# Patient Record
Sex: Male | Born: 1935 | Race: White | Hispanic: No | Marital: Married | Smoking: Former smoker
Health system: Southern US, Community
[De-identification: ages and names within clinical notes are randomized; demographics above are authoritative.]

## PROBLEM LIST (undated history)

## (undated) DIAGNOSIS — M791 Myalgia, unspecified site: Secondary | ICD-10-CM

## (undated) DIAGNOSIS — C768 Malignant neoplasm of other specified ill-defined sites: Secondary | ICD-10-CM

## (undated) DIAGNOSIS — J449 Chronic obstructive pulmonary disease, unspecified: Secondary | ICD-10-CM

## (undated) DIAGNOSIS — C61 Malignant neoplasm of prostate: Secondary | ICD-10-CM

## (undated) DIAGNOSIS — I517 Cardiomegaly: Secondary | ICD-10-CM

## (undated) DIAGNOSIS — E785 Hyperlipidemia, unspecified: Secondary | ICD-10-CM

## (undated) DIAGNOSIS — E119 Type 2 diabetes mellitus without complications: Secondary | ICD-10-CM

## (undated) DIAGNOSIS — Z950 Presence of cardiac pacemaker: Secondary | ICD-10-CM

## (undated) DIAGNOSIS — I2510 Atherosclerotic heart disease of native coronary artery without angina pectoris: Secondary | ICD-10-CM

## (undated) DIAGNOSIS — M1990 Unspecified osteoarthritis, unspecified site: Secondary | ICD-10-CM

## (undated) DIAGNOSIS — I10 Essential (primary) hypertension: Secondary | ICD-10-CM

## (undated) DIAGNOSIS — K5732 Diverticulitis of large intestine without perforation or abscess without bleeding: Secondary | ICD-10-CM

## (undated) DIAGNOSIS — M810 Age-related osteoporosis without current pathological fracture: Secondary | ICD-10-CM

## (undated) DIAGNOSIS — I442 Atrioventricular block, complete: Secondary | ICD-10-CM

## (undated) HISTORY — DX: Unspecified osteoarthritis, unspecified site: M19.90

## (undated) HISTORY — DX: Cardiomegaly: I51.7

## (undated) HISTORY — DX: Atrioventricular block, complete: I44.2

## (undated) HISTORY — DX: Malignant neoplasm of other specified ill-defined sites: C76.8

## (undated) HISTORY — DX: Hyperlipidemia, unspecified: E78.5

## (undated) HISTORY — DX: Atherosclerotic heart disease of native coronary artery without angina pectoris: I25.10

## (undated) HISTORY — DX: Diverticulitis of large intestine without perforation or abscess without bleeding: K57.32

## (undated) HISTORY — DX: Chronic obstructive pulmonary disease, unspecified: J44.9

## (undated) HISTORY — PX: CORONARY ANGIOPLASTY: SHX604

## (undated) HISTORY — DX: Malignant neoplasm of prostate: C61

## (undated) HISTORY — PX: BACK SURGERY: SHX140

## (undated) HISTORY — PX: OTHER SURGICAL HISTORY: SHX169

## (undated) HISTORY — PX: PROSTATE SURGERY: SHX751

## (undated) HISTORY — DX: Myalgia, unspecified site: M79.10

## (undated) HISTORY — DX: Age-related osteoporosis without current pathological fracture: M81.0

## (undated) HISTORY — DX: Essential (primary) hypertension: I10

## (undated) HISTORY — PX: PACEMAKER INSERTION: SHX728

## (undated) HISTORY — PX: TOTAL HIP ARTHROPLASTY: SHX124

## (undated) HISTORY — DX: Type 2 diabetes mellitus without complications: E11.9

## (undated) HISTORY — DX: Presence of cardiac pacemaker: Z95.0

---

## 2000-04-12 ENCOUNTER — Inpatient Hospital Stay (HOSPITAL_COMMUNITY): Admission: RE | Admit: 2000-04-12 | Discharge: 2000-04-14 | Payer: Self-pay | Admitting: Neurosurgery

## 2000-04-12 ENCOUNTER — Encounter: Payer: Self-pay | Admitting: Neurosurgery

## 2004-08-04 ENCOUNTER — Ambulatory Visit: Payer: Self-pay | Admitting: Endocrinology

## 2004-10-05 ENCOUNTER — Ambulatory Visit: Payer: Self-pay | Admitting: Unknown Physician Specialty

## 2006-06-28 ENCOUNTER — Other Ambulatory Visit: Payer: Self-pay

## 2006-06-29 ENCOUNTER — Inpatient Hospital Stay: Payer: Self-pay | Admitting: Unknown Physician Specialty

## 2007-03-17 ENCOUNTER — Encounter (INDEPENDENT_AMBULATORY_CARE_PROVIDER_SITE_OTHER): Payer: Self-pay | Admitting: *Deleted

## 2007-03-17 ENCOUNTER — Emergency Department: Payer: Self-pay | Admitting: Emergency Medicine

## 2007-03-17 ENCOUNTER — Inpatient Hospital Stay (HOSPITAL_COMMUNITY): Admission: AD | Admit: 2007-03-17 | Discharge: 2007-03-29 | Payer: Self-pay | Admitting: *Deleted

## 2007-03-23 ENCOUNTER — Ambulatory Visit: Payer: Self-pay | Admitting: Internal Medicine

## 2008-07-12 IMAGING — CR DG CHEST 1V
1 series · 1 of 1 positions shown · non-contrast
Comparison: none

REASON FOR EXAM: Preop for hip fracture
COMMENTS:

[view not recorded]
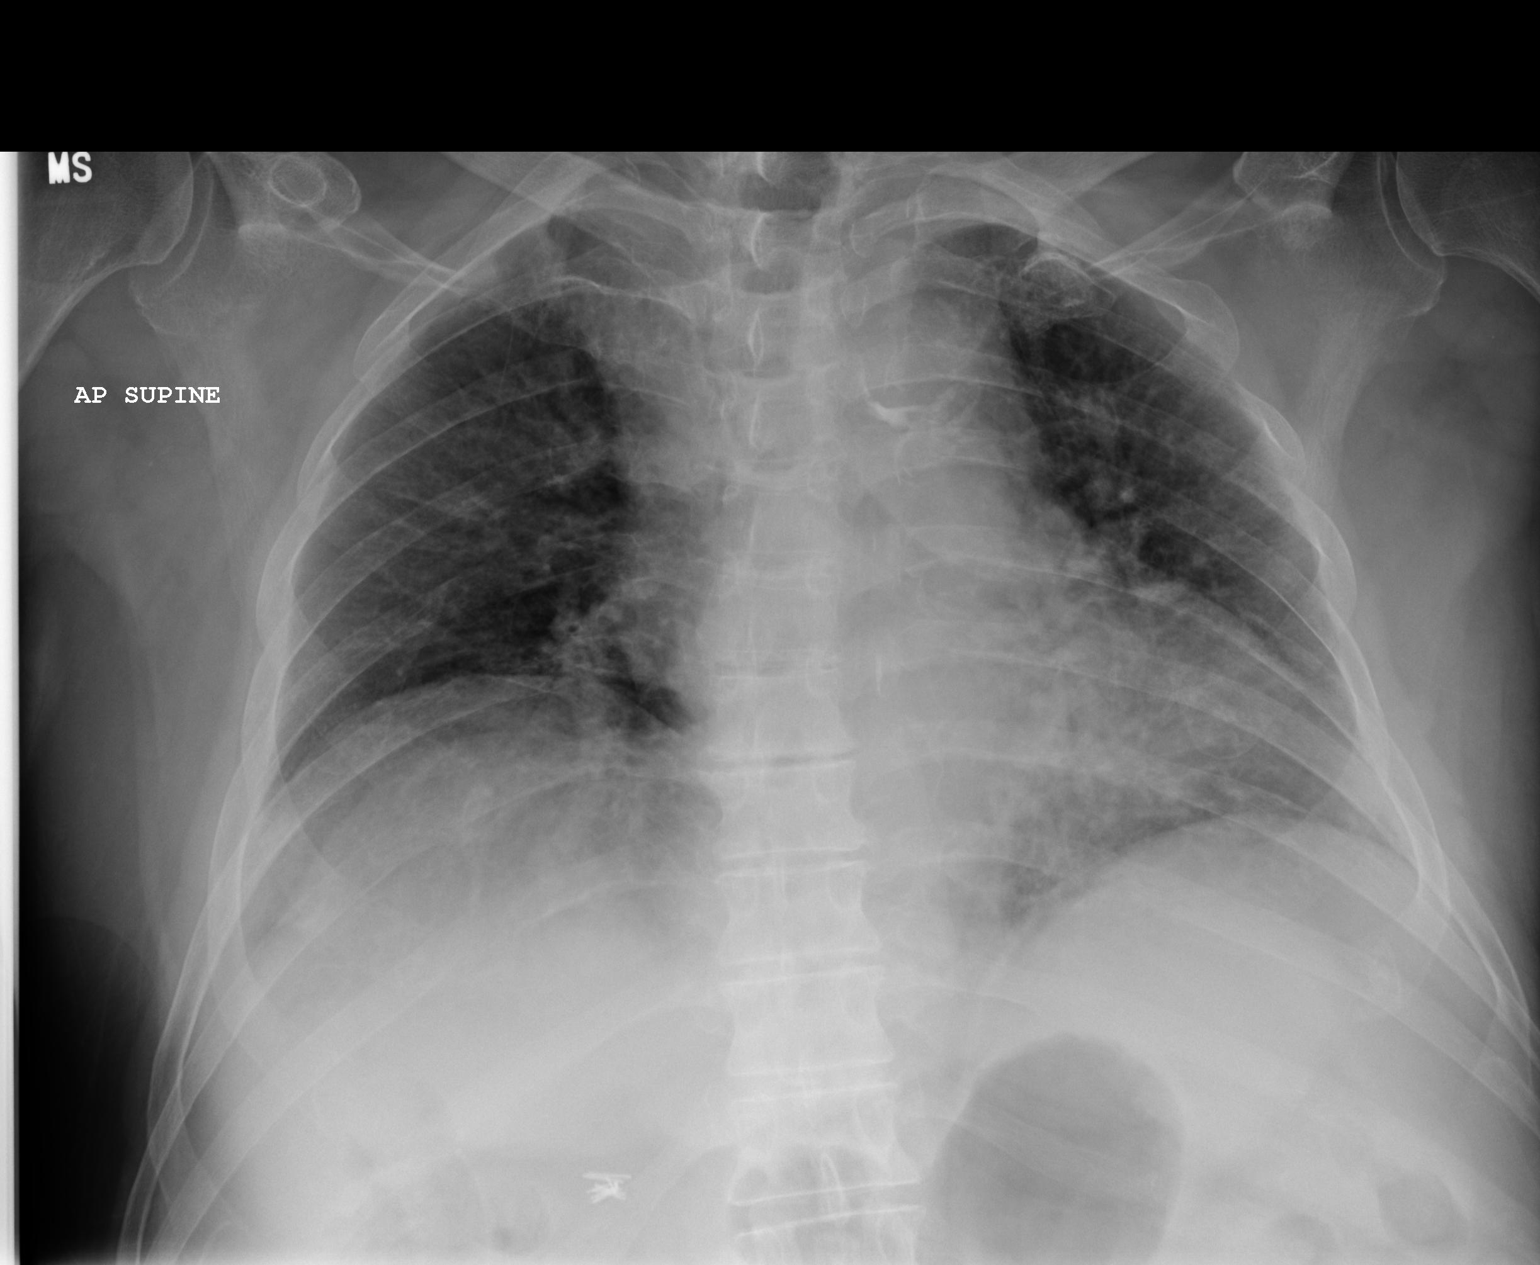

[1 of 1 positions shown; findings below may reference images not displayed]

PROCEDURE:     DXR - DXR CHEST 1 VIEWAP OR PA  - June 29, 2006 [DATE]

RESULT:     Single AP portable exam reveals incomplete inspiratory effort.
Mild cardiomegaly with LEFT ventricular prominence is seen.  Vascularity
appears top normal to mildly prominent.  There are findings which can be
compatible with mild pulmonary venous congestion.  No definite infiltrates.
No effusions.
IMPRESSION: Incomplete inspiratory effort.

Mild cardiomegaly with possibly mild pulmonary venous congestion.

## 2008-07-12 IMAGING — CR DG HIP COMPLETE 2+V*L*
1 series · 2 of 2 positions shown · non-contrast
Comparison: none

REASON FOR EXAM: fall, LEFT hip pain and deformity
COMMENTS:

PROCEDURE:     DXR - DXR HIP LEFT COMPLETE  - June 29, 2006 [DATE]
RESULT:          AP and lateral view reveals a transverse femoral neck
fracture.  The joint space appears intact.

[Series 1: view not recorded · 0.17mm/px · 2 of 2 slices shown]
[im 1/2]
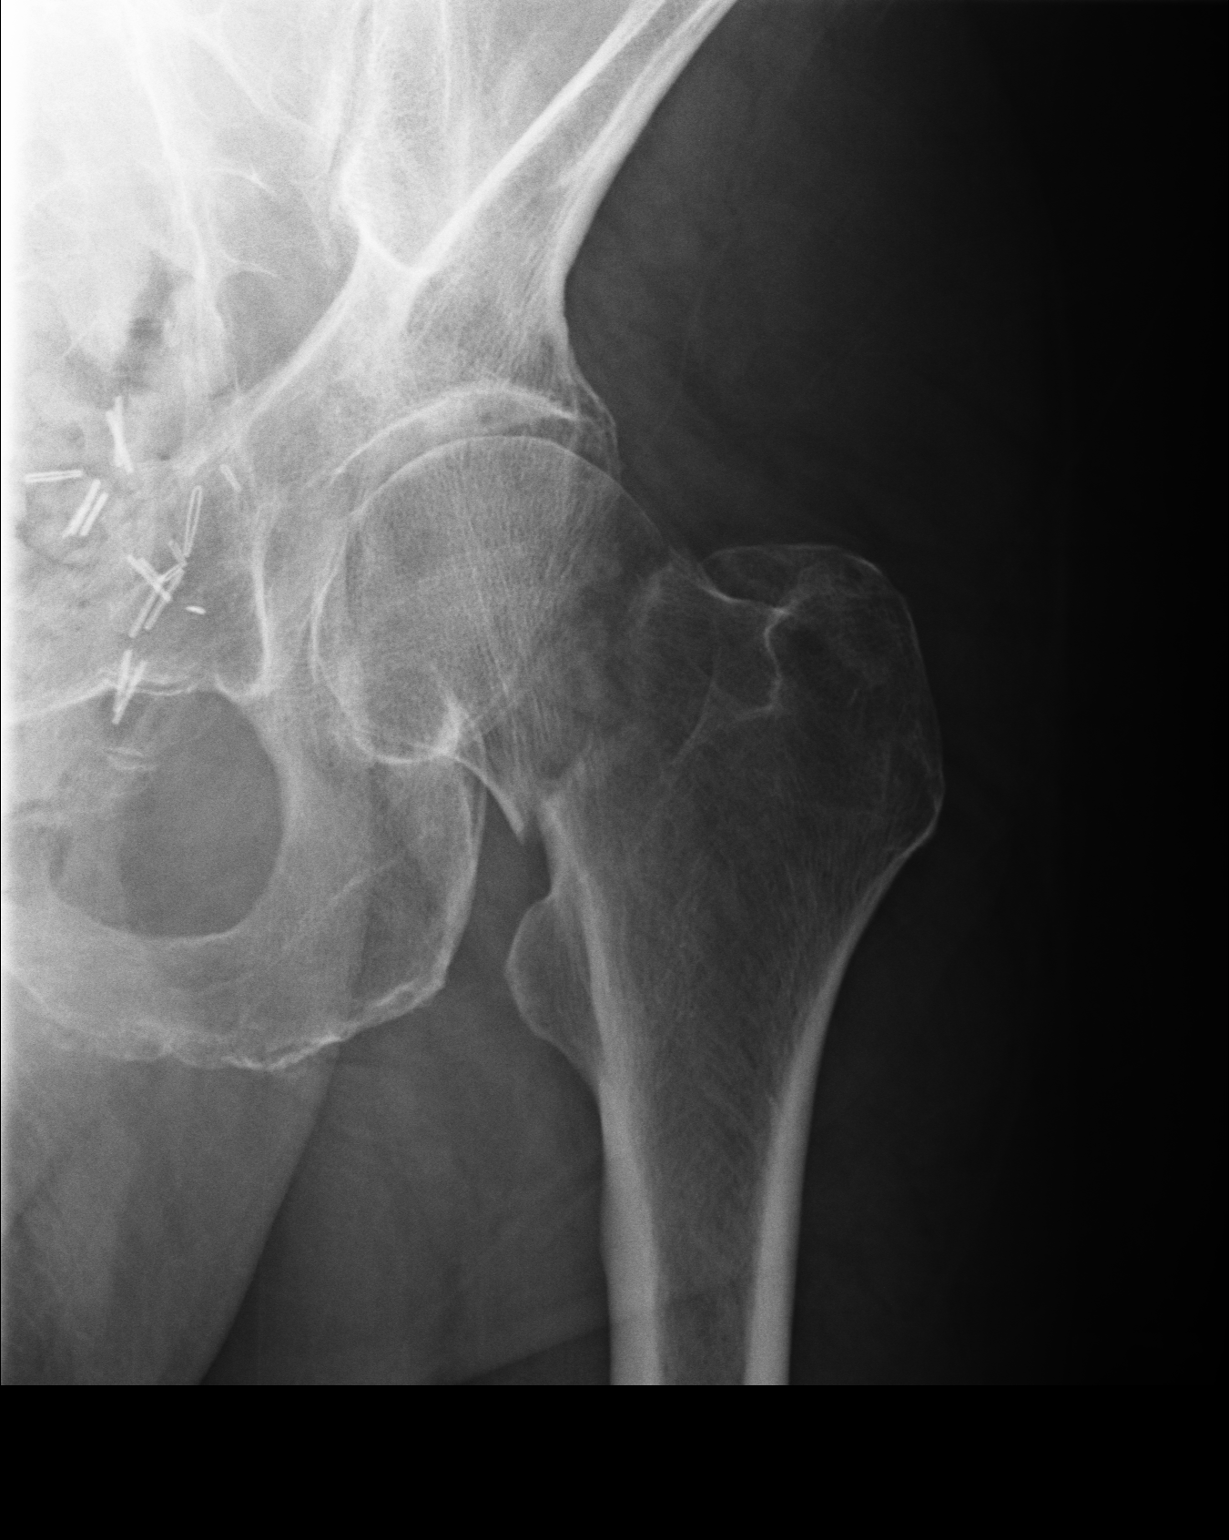
[im 2/2]
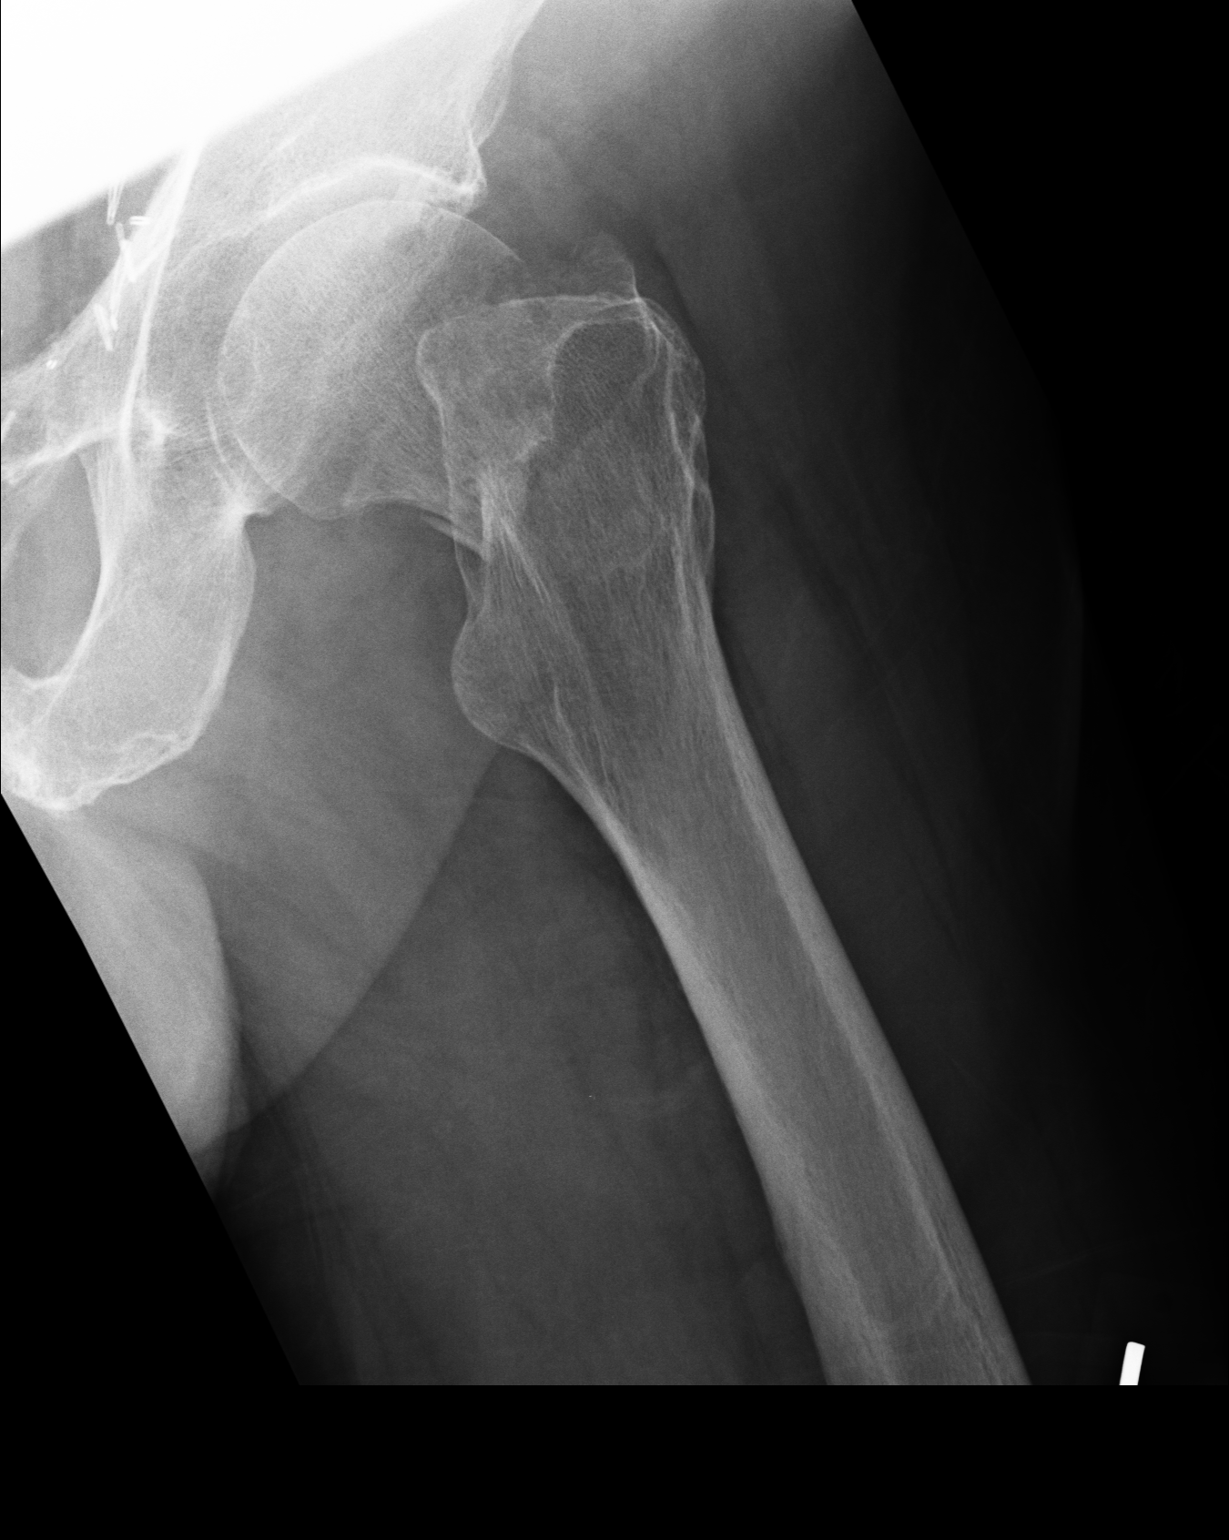

[2 of 2 positions shown; findings below may reference images not displayed]

IMPRESSION: Transverse fracture of the LEFT femoral neck.

## 2008-07-12 IMAGING — CR PELVIS - 1-2 VIEW
1 series · 1 of 1 positions shown · non-contrast
Comparison: none

REASON FOR EXAM: fall, L hip and pelvis pain
COMMENTS:

[view not recorded]
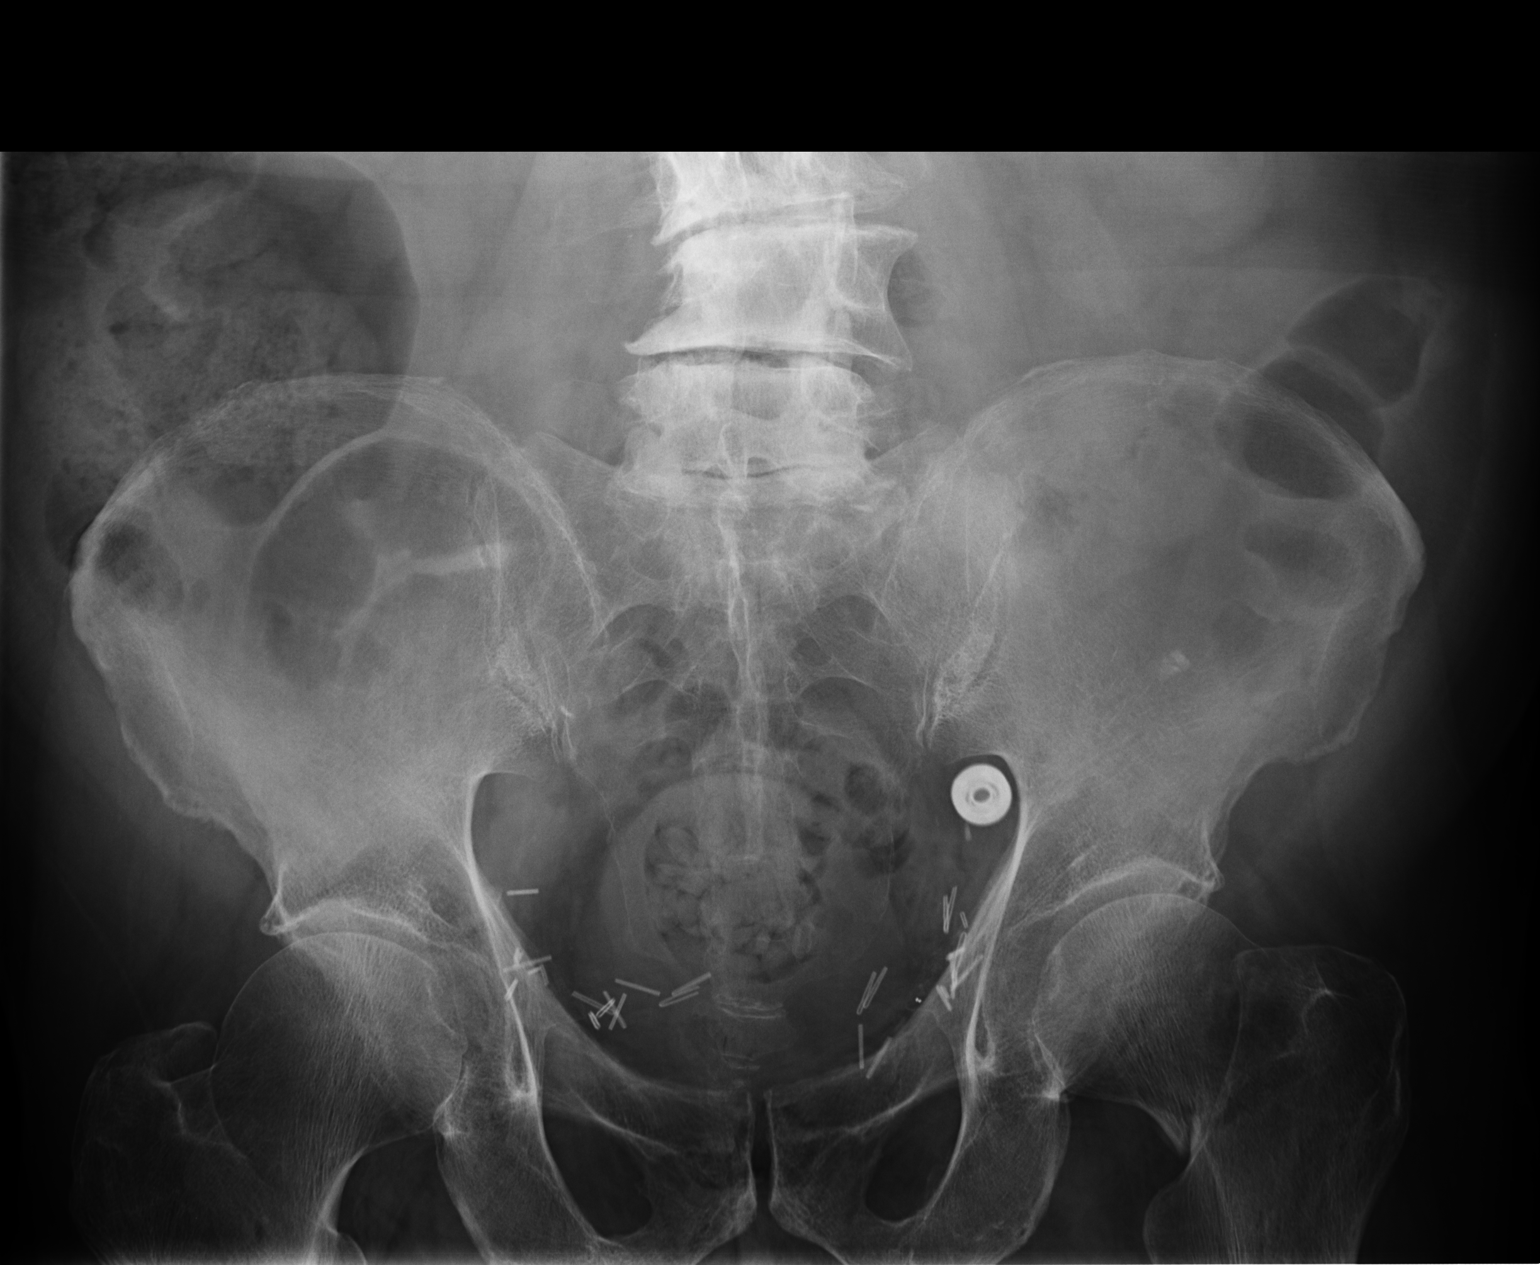

[1 of 1 positions shown; findings below may reference images not displayed]

PROCEDURE:     DXR - DXR PELVIS AP ONLY  - June 29, 2006 [DATE]

RESULT:          A single AP view reveals the LEFT femoral neck fracture.
The inferior pubic rami are not included on the film for interpretation.  No
additional fracture is seen.  However, multiple surgical clips are noted in
the pelvis.

There is noted scoliosis of the lower lumbar spine with concavity to the
RIGHT with degenerative changes of the lower lumbar spine.
IMPRESSION: LEFT femoral neck fracture.  No additional fracture is
seen of the pelvis.

## 2008-07-13 IMAGING — CR PELVIS - 1-2 VIEW
1 series · 1 of 1 positions shown · non-contrast
Comparison: none

REASON FOR EXAM: post-op THR
COMMENTS:  Bedside (portable):Y

PROCEDURE:     DXR - DXR PELVIS AP ONLY  - June 30, 2006  [DATE]
RESULT:     AP view reveals prosthetic LEFT hip.  Head is seated within the
acetabulum and staying within the medullary canala.

[view not recorded]
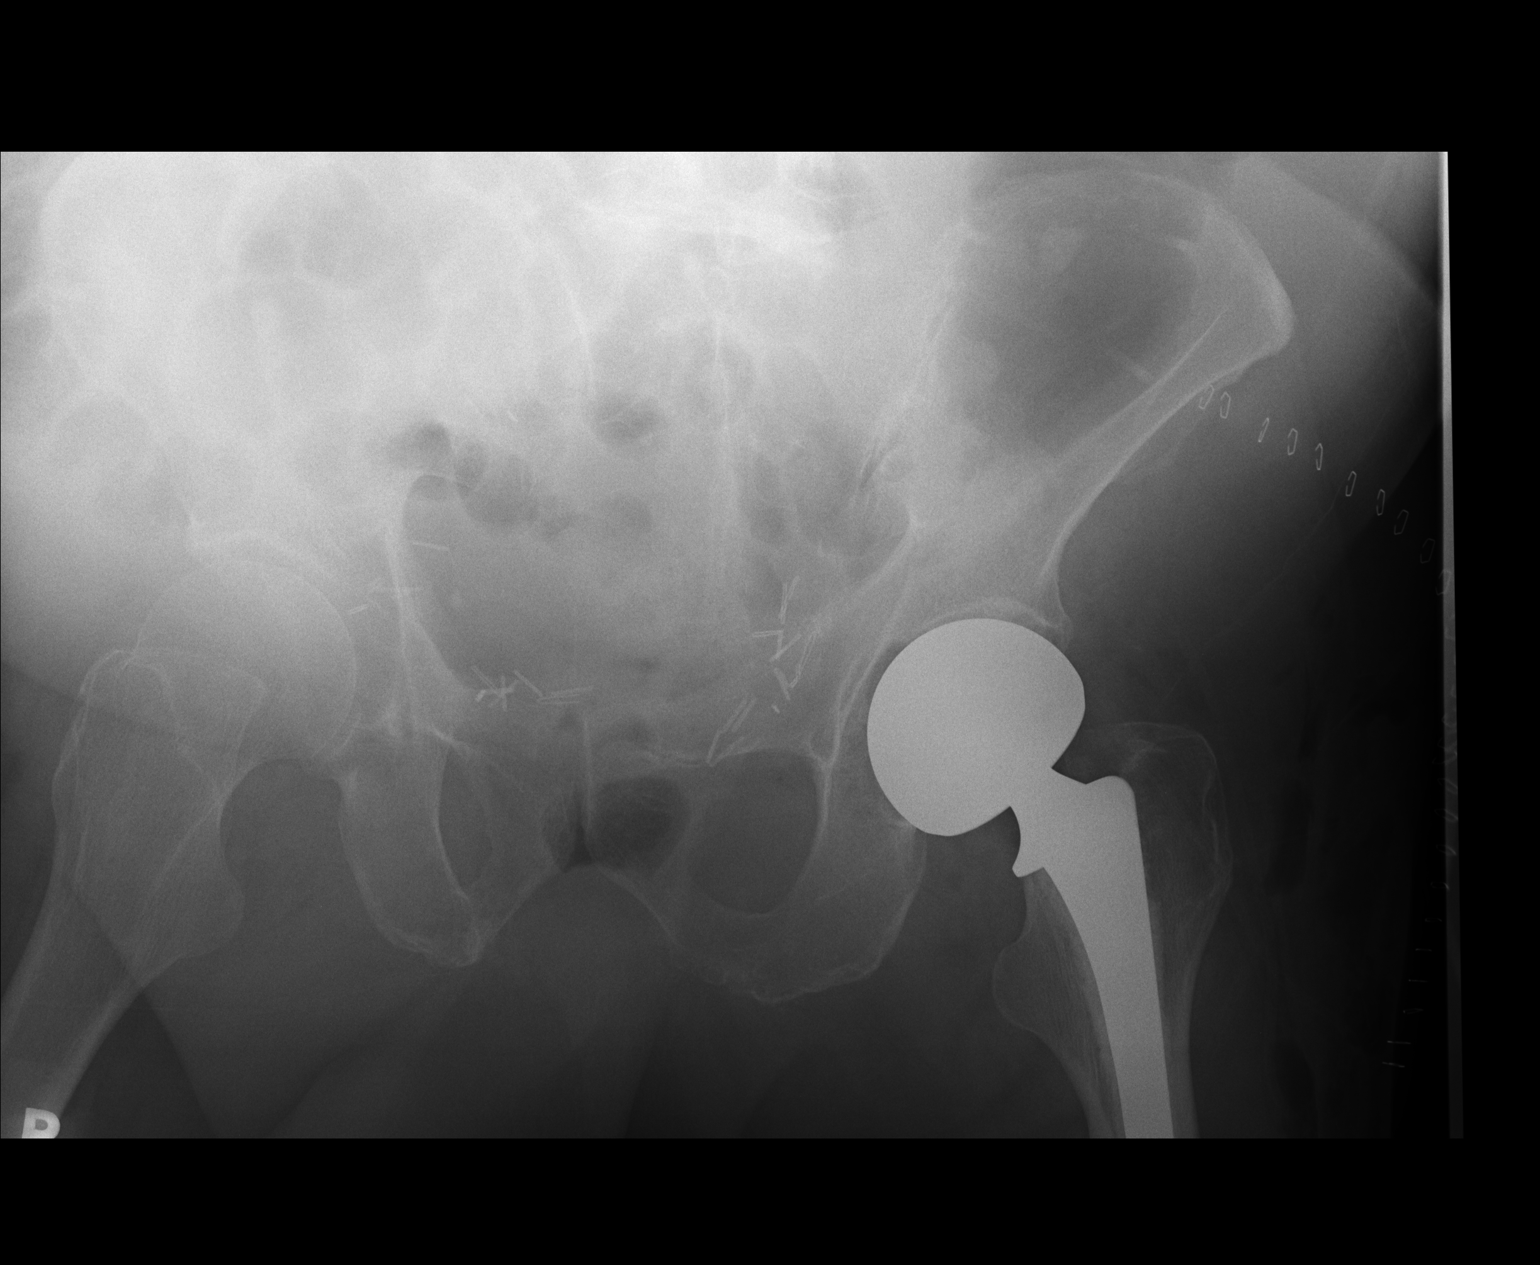

[1 of 1 positions shown; findings below may reference images not displayed]

IMPRESSION: Total LEFT hip prosthesis.

## 2008-09-21 ENCOUNTER — Emergency Department: Payer: Self-pay | Admitting: Emergency Medicine

## 2008-10-30 ENCOUNTER — Emergency Department: Payer: Self-pay | Admitting: Urology

## 2009-11-05 ENCOUNTER — Encounter: Payer: Self-pay | Admitting: Cardiovascular Disease

## 2009-11-10 ENCOUNTER — Ambulatory Visit: Payer: Self-pay | Admitting: Cardiovascular Disease

## 2009-11-10 DIAGNOSIS — R609 Edema, unspecified: Secondary | ICD-10-CM

## 2009-11-10 DIAGNOSIS — R578 Other shock: Secondary | ICD-10-CM | POA: Insufficient documentation

## 2009-11-10 DIAGNOSIS — I442 Atrioventricular block, complete: Secondary | ICD-10-CM | POA: Insufficient documentation

## 2009-11-10 DIAGNOSIS — I10 Essential (primary) hypertension: Secondary | ICD-10-CM | POA: Insufficient documentation

## 2010-03-22 ENCOUNTER — Ambulatory Visit: Payer: Self-pay | Admitting: Internal Medicine

## 2010-03-22 ENCOUNTER — Encounter: Payer: Self-pay | Admitting: Cardiovascular Disease

## 2010-03-22 DIAGNOSIS — Z950 Presence of cardiac pacemaker: Secondary | ICD-10-CM | POA: Insufficient documentation

## 2010-03-23 ENCOUNTER — Telehealth: Payer: Self-pay | Admitting: Internal Medicine

## 2010-03-23 LAB — CONVERTED CEMR LAB
Basophils Absolute: 0.1 10*3/uL (ref 0.0–0.1)
Eosinophils Relative: 2 % (ref 0–5)
HCT: 38.4 % — ABNORMAL LOW (ref 39.0–52.0)
Lymphocytes Relative: 13 % (ref 12–46)
Lymphs Abs: 1.2 10*3/uL (ref 0.7–4.0)
Neutrophils Relative %: 76 % (ref 43–77)
Platelets: 194 10*3/uL (ref 150–400)
RDW: 14.4 % (ref 11.5–15.5)
TSH: 2.401 microintl units/mL (ref 0.350–4.500)
WBC: 9.6 10*3/uL (ref 4.0–10.5)

## 2010-04-22 ENCOUNTER — Ambulatory Visit: Payer: Self-pay | Admitting: Internal Medicine

## 2010-04-26 ENCOUNTER — Encounter: Payer: Self-pay | Admitting: Internal Medicine

## 2010-05-27 ENCOUNTER — Ambulatory Visit: Payer: Self-pay | Admitting: Cardiovascular Disease

## 2010-05-27 DIAGNOSIS — E119 Type 2 diabetes mellitus without complications: Secondary | ICD-10-CM

## 2010-05-27 DIAGNOSIS — R011 Cardiac murmur, unspecified: Secondary | ICD-10-CM

## 2010-05-27 DIAGNOSIS — R42 Dizziness and giddiness: Secondary | ICD-10-CM

## 2010-05-28 ENCOUNTER — Ambulatory Visit: Payer: Self-pay

## 2010-07-22 ENCOUNTER — Ambulatory Visit: Payer: Self-pay | Admitting: Internal Medicine

## 2010-10-12 ENCOUNTER — Encounter: Payer: Self-pay | Admitting: Internal Medicine

## 2010-10-12 NOTE — Cardiovascular Report (Signed)
Summary: TTM   TTM   Imported By: Roderic Ovens 05/04/2010 11:25:50  _____________________________________________________________________  External Attachment:    Type:   Image     Comment:   External Document

## 2010-10-12 NOTE — Assessment & Plan Note (Signed)
Summary: f6   Visit Type:  Follow-up Referring Provider:  Mariah Milling Primary Provider:  Jonette Pesa, MD  CC:  Denies chest pain but has some shortness of breath..  History of Present Illness: Mr. Derek [name] is a very pleasant 77 year old gentleman patient of Dr. Sharin Grave, with a history of complete heart block, pacemaker placed in March 23, 2007, history of lower extremity weakness with no significant coronary artery disease by catheterization in July 2008, normal systolic function, hypertension, lower extremity edema and hyperlipidemia who presentsfor routine follow up.  his wife reports that he is doing well overall. He did have an episode of dizziness in August during which time he could not get out of bed. He had to lay in bed until his symptoms resolve. He tried to sit up numerous times though felt dizzy and had to lie back down. He has not had any further episodes since that time.  he's not very active, does walk with 2 canes. His edema has improved. He is also been trying to watch his weight.  EKG shows paced rhythm at a rate of 98 beats per minute. Without magnet, rate is 68 beats per minute with left bundle     Current Medications (verified): 1)  Metaglip 2.5-250 Mg Tabs (Glipizide-Metformin Hcl) .... 4 By Mouth Daily 2)  Plavix 75 Mg Tabs (Clopidogrel Bisulfate) .... Take One Tablet By Mouth Daily 3)  Avapro 150 Mg Tabs (Irbesartan) .Marland Kitchen.. 1 By Mouth Once Daily 4)  Furosemide 40 Mg Tabs (Furosemide) .... Take One Tablet By Mouth Daily. 5)  Lovastatin 40 Mg Tabs (Lovastatin) .... Take One Tablet By Mouth Daily At Bedtime 6)  Onglyza 5 Mg Tabs (Saxagliptin Hcl) .Marland Kitchen.. 1 By Mouth Once Daily 7)  Vitamin E 400 Unit Caps (Vitamin E) .... 2 By Mouth Once Daily 8)  Multivitamins  Caps (Multiple Vitamin) .Marland Kitchen.. 1 By Mouth Once Daily 9)  Aspirin 81 Mg Tbec (Aspirin) .... Take One Tablet By Mouth Daily 10)  Vitamin D3 1000 Unit Caps (Cholecalciferol) .... Four  Tablets Everyday 11)   Calcium  Allergies (verified): 1)  ! Pcn  Past History:  Past Medical History: Last updated: 11/14/2009 Pacemaker Hypertension DM Urinary Problems Arthririts Gout Cancer Prostate  Past Surgical History: Last updated: 11/14/09 hip replacement prostate pacemaker cath  Family History: Last updated: November 14, 2009 Father: deceased 60; old age Mother: deceased ? old age Sister:  decesed 52: old age  Social History: Last updated: 11-14-09 Retired  Married  Tobacco Use - Former.  Alcohol Use - no Regular Exercise - no Drug Use - no  Risk Factors: Alcohol Use: 0 (11/14/09) Caffeine Use: no (11/14/2009) Exercise: no (14-Nov-2009)  Risk Factors: Smoking Status: quit (2009/11/14)  Review of Systems  The patient denies fever, weight loss, weight gain, vision loss, decreased hearing, hoarseness, chest pain, syncope, dyspnea on exertion, peripheral edema, prolonged cough, abdominal pain, incontinence, muscle weakness, depression, and enlarged lymph nodes.         Dizzy episode in August  Vital Signs:  Patient profile:   77 year old male Height:      70 inches Weight:      204.25 pounds BMI:     29.41 Pulse rate:   68 / minute BP sitting:   140 / 80  (left arm) Cuff size:   regular  Vitals Entered By: Bishop Dublin, CMA (May 27, 2010 3:12 PM)  Physical Exam  General:  Well developed, well nourished, in no acute distress.uses 2 canes to ambulate Head:  normocephalic  and atraumatic Neck:  Neck supple, no JVD. No masses, thyromegaly or abnormal cervical nodes. Lungs:  Clear bilaterally to auscultation and percussion. Heart:  Non-displaced PMI, chest non-tender; regular rate and rhythm, S1, S2 with III/VI SEM at the RSB radiating to LSB, no rubs or gallops. Carotid upstroke normal, no bruit. Pedals normal pulses. Trace edema, no varicosities. Abdomen:  abdomen soft and non-tender without masses Msk:  Back normal, normal gait. Muscle strength and tone  normal. Pulses:  pulses normal in all 4 extremities Extremities:  No clubbing or cyanosis. Neurologic:  Alert and oriented x 3. Skin:  Intact without lesions or rashes. Psych:  Normal affect.   PPM Specifications Following MD:  Sherryl Manges, MD     PPM Vendor:  St Jude     PPM Model Number:  [id number]     PPM Implanting MD:  [facility]   PPM Follow Up Pacer Dependent:  Yes      Parameters Mode:  ddd     Paced AV Delay:  250-200     Sensed AV Delay:  225-185  Impression & Recommendations:  Problem # 1:  CARDIAC MURMUR (ICD-785.2) he has a very prominent concerning for valvular disease, possibly aortic. His diffuse near his sternal notch radiating both to the left and right sternal borders. He did have an episode of significant dizziness/near syncope  in August raising the concern of severe aortic stenosis. Echocardiogram has been scheduled for tomorrow.  His updated medication list for this problem includes:    Avapro 150 Mg Tabs (Irbesartan) .Marland Kitchen... 1 by mouth once daily    Furosemide 40 Mg Tabs (Furosemide) .Marland Kitchen... Take one tablet by mouth daily.  Orders: Echocardiogram (Echo)  Problem # 2:  PACEMAKER, PERMANENT (ICD-V45.01) Complete heart block with pacemaker. Pacemaker is followed by EP at [facility].  Orders: EKG w/ Interpretation (93000)  Problem # 3:  EDEMA (ICD-782.3) his edema has improved. He is on Lasix daily. We have made no medication changes at this time.  Problem # 4:  HYPERTENSION, BENIGN (ICD-401.1) blood pressure is borderline elevated. On his last clinic visit, I did increase his Avapro to 300 mg daily. He continues on 150 mg daily. We have left this dose unchanged for now.  His updated medication list for this problem includes:    Avapro 150 Mg Tabs (Irbesartan) .Marland Kitchen... 1 by mouth once daily    Furosemide 40 Mg Tabs (Furosemide) .Marland Kitchen... Take one tablet by mouth daily.    Aspirin 81 Mg Tbec (Aspirin) .Marland Kitchen... Take one tablet by mouth daily  Problem # 5:  DIAB W/O COMP  TYPE II/UNS NOT STATED UNCNTRL (ICD-250.00) Diabetes needs some improvement per his report. He continues to have an elevated glucose levels.  His primary care physician is assisting with this management. His updated medication list for this problem includes:    Metaglip 2.5-250 Mg Tabs (Glipizide-metformin hcl) .Marland KitchenMarland KitchenMarland KitchenMarland Kitchen 4 by mouth daily    Avapro 150 Mg Tabs (Irbesartan) .Marland Kitchen... 1 by mouth once daily    Onglyza 5 Mg Tabs (Saxagliptin hcl) .Marland Kitchen... 1 by mouth once daily    Aspirin 81 Mg Tbec (Aspirin) .Marland Kitchen... Take one tablet by mouth daily  Patient Instructions: 1)  Your physician recommends that you continue on your current medications as directed. Please refer to the Current Medication list given to you today. 2)  Your physician has requested that you have an echocardiogram.  Echocardiography is a painless test that uses sound waves to create images of your heart. It provides your doctor  with information about the size and shape of your heart and how well your heart's chambers and valves are working.  This procedure takes approximately one hour. There are no restrictions for this procedure.

## 2010-10-12 NOTE — Progress Notes (Signed)
  Phone Note Outgoing Call   Summary of Call: CALLED PT TO LET HIM KNOW HE WAS ENROLLED IN MEDNET.  WILL SEND TRANSMITTER AND CALL WHEN IT'S TIME FOR PHONE CHECK.  UNABLE TO ENROLL IN [place].  SPOKE W/WIFE AND VOICES UNDERSTANDING.  TO CALL IF SHE HAS ANY QUESTIONS. Vella Kohler  March 23, 2010 8:58 AM

## 2010-10-12 NOTE — Assessment & Plan Note (Signed)
Summary: nep   Visit Type:  Follow-up Referring Provider:  Mariah Milling Primary Provider:  Jonette Pesa, MD  CC:  c/o shortness of breath and swelling in left lef..  History of Present Illness: Mr. Bielinski is 77 year old with a history of pacemaker implantation for complete heart block and 2008. I catheterization at that time he had normal coronary arteries normal systolic function.    Patient states that overall he has been doing well. He has been very weak at home and his wife states that he does nothing during the daytime. He sleeps during the day, his awake at night and does not do any exercise. She states that this is why his legs are so weak. He denies any falls though he does not do any walking and rarely goes walking outside the house.     Current Medications (verified): 1)  Metaglip 2.5-250 Mg Tabs (Glipizide-Metformin Hcl) .... 4 By Mouth Daily 2)  Plavix 75 Mg Tabs (Clopidogrel Bisulfate) .... Take One Tablet By Mouth Daily 3)  Avapro 150 Mg Tabs (Irbesartan) .Marland Kitchen.. 1 By Mouth Once Daily 4)  Furosemide 40 Mg Tabs (Furosemide) .... Take One Tablet By Mouth Daily. 5)  Lovastatin 40 Mg Tabs (Lovastatin) .... Take One Tablet By Mouth Daily At Bedtime 6)  Onglyza 5 Mg Tabs (Saxagliptin Hcl) .Marland Kitchen.. 1 By Mouth Once Daily 7)  Vitamin E 400 Unit Caps (Vitamin E) .... 2 By Mouth Once Daily 8)  Multivitamins  Caps (Multiple Vitamin) .Marland Kitchen.. 1 By Mouth Once Daily 9)  Aspirin 81 Mg Tbec (Aspirin) .... Take One Tablet By Mouth Daily 10)  Vitamin D3 1000 Unit Caps (Cholecalciferol) .... Three Tablets Everyday 11)  Calcium  Allergies (verified): 1)  ! Pcn  Past History:  Past Medical History: Last updated: 11/24/2009 Pacemaker Hypertension DM Urinary Problems Arthririts Gout Cancer Prostate  Past Surgical History: Last updated: 11-24-2009 hip replacement prostate pacemaker cath  Family History: Last updated: 11/24/09 Father: deceased 8; old age Mother: deceased ? old age Sister:   decesed 42: old age  Social History: Last updated: 11-24-09 Retired  Married  Tobacco Use - Former.  Alcohol Use - no Regular Exercise - no Drug Use - no  Review of Systems       as above  Vital Signs:  Patient profile:   77 year old male Height:      70 inches Weight:      206 pounds BMI:     29.66 Pulse rate:   103 / minute BP sitting:   141 / 79  (right arm) Cuff size:   regular  Vitals Entered By: Bishop Dublin, CMA (March 22, 2010 2:19 PM)  Physical Exam  General:  elderly gentleman in no apparent distress, alert and oriented x3, HEENT exam is normal apart from an opacified right eye, neck is supple with no JVP or carotid bruits, heart sounds are regular with normal S1-S2 and 2/6 high-pitched murmur on the right upper sternal borderappreciated, lungs are clear to auscultation with no wheezes Rales, abdominal exam is benign, 1+  lower extremity edema, neurologic exam is grossly nonfocal, skin is warm and dry, pulses are equal and symmetrical in her upper and lower extremities.    PPM Specifications Following MD:  Sherryl Manges, MD     PPM Vendor:  [employer] Jude     PPM Model Number:  [id number]     PPM Implanting MD:  [facility]   PPM Follow Up Battery Voltage:  2.79 V     Battery Est. Longevity:  10 yrs     Pacer Dependent:  Yes       PPM Device Measurements Atrium  Amplitude: 2 mV, Impedance: 337 ohms,  Right Ventricle  Amplitude: 0 mV, Impedance: 468 ohms, Threshold: .25 V at .4 msec  Episodes MS Episodes:  yes     Percent Mode Switch:  trivial      Parameters Mode:  ddd     Paced AV Delay:  250-200     Sensed AV Delay:  225-185 MD Comments:  device is functioning normally AV delays were shortened the cardia was noted with a resting heart rate of about 90 this is quite discordant from heart rate histogram  Impression & Recommendations:  Problem # 1:  UNSPECIFIED TACHYCARDIA (ICD-785.0) patient has a tachycardia which is sinus. This is discordant as noted. We will  check to see if there is evidence of a primary underlying problem i.e. thyroid or hemoglobin last blood work that was checked was in March Orders: T-TSH [phone]) T-CBC w/Diff [id number])  Problem # 2:  AV BLOCK, COMPLETE (ICD-426.0)  stable post device implantion  His updated medication list for this problem includes:    Plavix 75 Mg Tabs (Clopidogrel bisulfate) .Marland Kitchen... Take one tablet by mouth daily    Aspirin 81 Mg Tbec (Aspirin) .Marland Kitchen... Take one tablet by mouth daily  Problem # 3:  PACEMAKER, PERMANENT (ICD-V45.01) Device parameters and data were reviewed and no changes were made  Patient Instructions: 1)  Your physician recommends that you continue on your current medications as directed. Please refer to the Current Medication list given to you today. 2)  Remote monitoring is used to monitor your Pacemaker or ICD from home. This monitoring reduces the number of office visits required to check your device to one time per year.  It allows [place] to keep an eye on the functioning of your device to ensure it is working properly. You are scheduled for a device check from home on     . You may send your transmission at any time that day. If you have a wireless device, the transmission will be sent automatically. After your physician reviews your transmission, you will receive a postcard with your next transmission date. 3)  Your physician recommends that you have lab work today (TSH/CBC)

## 2010-10-12 NOTE — Letter (Signed)
Summary: Medical Record Release  Medical Record Release   Imported By: Harlon Flor 11/09/2009 08:20:57  _____________________________________________________________________  External Attachment:    Type:   Image     Comment:   External Document

## 2010-10-12 NOTE — Assessment & Plan Note (Signed)
Summary: NEW PT   Visit Type:  New Patient Referring Provider:  Mariah Milling Primary Provider:  Jonette Pesa, MD  CC:  no cp, little shortness of breath once in while, and edema in ankles and feet (left more so that right)..  History of Present Illness: Derek [name] is a very pleasant 77 year old gentleman that I had seen previously in June 2010 at [facility], patient of Dr. Sharin Grave, with a history of complete heart block, pacemaker placed in March 23, 2007, history of lower extremity weakness with no significant coronary artery disease by catheterization in July 2008, normal systolic function, hypertension, lower extremity edema and hyperlipidemia who presents to reestablish care.  Patient states that overall he has been doing well. He has been very weak at home and his wife states that he does nothing during the daytime. He sleeps during the day, his awake at night and does not do any exercise. She states that this is why his legs are so weak. He denies any falls though he does not do any walking and rarely goes walking outside the house.  He use to participate in physical therapy at [facility] physical therapy near his house but has not done this for quite some time. He does have chronic weakness of the left leg.  Most recent pacer interrogation done September 25, 1999 shows underlying complete heart block, a paced 68% of the time, V. paced greater than 99% of the time, 10 mode switches under 3 minutes, PVA B. was increased to address this. He has a Engineer, structural serial number [id number] implanted March 23, 2007.   Preventive Screening-Counseling & Management  Alcohol-Tobacco     Alcohol drinks/day: 0     Smoking Status: quit     Year Quit: 1965  Caffeine-Diet-Exercise     Caffeine use/day: no     Does Patient Exercise: no      Drug Use:  no.    Current Medications (verified): 1)  Metaglip 2.5-250 Mg Tabs (Glipizide-Metformin Hcl) .... 4 By Mouth Daily 2)  Plavix 75  Mg Tabs (Clopidogrel Bisulfate) .... Take One Tablet By Mouth Daily 3)  Avapro 150 Mg Tabs (Irbesartan) .Marland Kitchen.. 1 By Mouth Once Daily 4)  Furosemide 40 Mg Tabs (Furosemide) .... Take One Tablet By Mouth Daily. 5)  Lovastatin 40 Mg Tabs (Lovastatin) .... Take One Tablet By Mouth Daily At Bedtime 6)  Onglyza 5 Mg Tabs (Saxagliptin Hcl) .Marland Kitchen.. 1 By Mouth Once Daily 7)  Vitamin E 400 Unit Caps (Vitamin E) .... 2 By Mouth Once Daily 8)  Multivitamins  Caps (Multiple Vitamin) .Marland Kitchen.. 1 By Mouth Once Daily 9)  Aspirin 81 Mg Tbec (Aspirin) .... Take One Tablet By Mouth Daily  Allergies (verified): 1)  ! Pcn  Past History:  Past Medical History: Pacemaker Hypertension DM Urinary Problems Arthririts Gout Cancer Prostate  Past Surgical History: hip replacement prostate pacemaker cath  Family History: Father: deceased 64; old age Mother: deceased ? old age Sister:  decesed 24: old age  Social History: Retired  Married  Tobacco Use - Former.  Alcohol Use - no Regular Exercise - no Drug Use - no Alcohol drinks/day:  0 Smoking Status:  quit Caffeine use/day:  no Does Patient Exercise:  no Drug Use:  no  Review of Systems  The patient denies fever, vision loss, decreased hearing, hoarseness, chest pain, syncope, dyspnea on exertion, peripheral edema, prolonged cough, abdominal pain, incontinence, muscle weakness, depression, and enlarged lymph nodes.  LE weakness  Vital Signs:  Patient profile:   77 year old male Height:      70 inches Weight:      212.50 pounds Pulse rate:   73 / minute Pulse rhythm:   regular BP sitting:   160 / 90  (left arm) Cuff size:   regular  Vitals Entered By: Mercer Pod (November 10, 2009 2:37 PM)  Physical Exam  General:  elderly gentleman in no apparent distress, alert and oriented x3, HEENT exam is benign, neck is supple with no JVP or carotid bruits, heart sounds are regular with normal S1-S2 and no murmurs appreciated, lungs are  clear to auscultation with no wheezes Rales, abdominal exam is benign, 1+  lower extremity edema, neurologic exam is grossly nonfocal, skin is warm and dry, pulses are equal and symmetrical in her upper and lower extremities.   New Orders:     1)  Misc. Referral (Misc. Ref)   EKG  Procedure date:  11/10/2009  Findings:      paced rhythm at 73 per minute, first degree AV block  Impression & Recommendations:  Problem # 1:  AV BLOCK, COMPLETE (ICD-426.0) history of complete heart block, pacemaker placed in 2008 per the last pacemaker interrogation noted in January 2011 at [facility]. We will set him up for repeat pacemaker interrogation at the lower July 2011.  His updated medication list for this problem includes:    Plavix 75 Mg Tabs (Clopidogrel bisulfate) .Marland Kitchen... Take one tablet by mouth daily    Aspirin 81 Mg Tbec (Aspirin) .Marland Kitchen... Take one tablet by mouth daily  Problem # 2:  HYPERTENSION, BENIGN (ICD-401.1) Blood pressure is elevated today and we will increase his Avapro/irbesartan to 300 mg daily.  His updated medication list for this problem includes:    Avapro 150 Mg Tabs (Irbesartan) .Marland Kitchen... 1 by mouth once daily    Furosemide 40 Mg Tabs (Furosemide) .Marland Kitchen... Take one tablet by mouth daily.    Aspirin 81 Mg Tbec (Aspirin) .Marland Kitchen... Take one tablet by mouth daily  Problem # 3:  EDEMA (ICD-782.3) He does have chronic lower extremity edema for which he takes Lasix. We will continue him on his current dose of 40 mg daily.  Other Orders: Misc. Referral (Misc. Ref)  Patient Instructions: 1)  Your physician recommends that you schedule a follow-up appointment in: 6 months, July with Dr. Graciela Husbands 2)  Your physician recommends that you continue on your current medications as directed. Please refer to the Current Medication list given to you today. 3)  You have been referred to [facility]- 3/8 @ 8:30.  Please arrive @ 8:15.

## 2010-10-20 NOTE — Miscellaneous (Signed)
Summary: Device preload  Clinical Lists Changes  Observations: Added new observation of PPM INDICATN: Sick sinus syndrome (10/12/2010 19:15) Added new observation of MAGNET RTE: BOL 98.6 ERI 86.3 (10/12/2010 19:15) Added new observation of PPMLEADSTAT2: active (10/12/2010 19:15) Added new observation of PPMLEADSER2: [id number] (10/12/2010 19:15) Added new observation of PPMLEADMOD2: 1788TC (10/12/2010 19:15) Added new observation of PPMLEADDOI2: 03/23/2007 (10/12/2010 19:15) Added new observation of PPMLEADLOC2: RV (10/12/2010 19:15) Added new observation of PPMLEADSTAT1: active (10/12/2010 19:15) Added new observation of PPMLEADSER1: [id number] (10/12/2010 19:15) Added new observation of PPMLEADMOD1: 1699TC (10/12/2010 19:15) Added new observation of PPMLEADDOI1: 03/23/2007 (10/12/2010 19:15) Added new observation of PPMLEADLOC1: RA (10/12/2010 19:15) Added new observation of PPM IMP MD: SEHV (10/12/2010 19:15) Added new observation of PPM DOI: 03/23/2007 (10/12/2010 19:15) Added new observation of PPM SERL#: [id number]  (10/12/2010 19:15)      PPM Specifications Following MD:  Sherryl Manges, MD     PPM Vendor:  [employer]     PPM Model Number:  [id number]     PPM Serial Number:  [id number] PPM DOI:  03/23/2007     PPM Implanting MD:  [facility]  Lead 1    Location: RA     DOI: 03/23/2007     Model #: [id number]     Serial #: [id number]     Status: active Lead 2    Location: RV     DOI: 03/23/2007     Model #: [id number]     Serial #: [id number]     Status: active  Magnet Response Rate:  BOL 98.6 ERI 86.3  Indications:  Sick sinus syndrome   PPM Follow Up Pacer Dependent:  Yes      Parameters Mode:  ddd     Paced AV Delay:  250-200     Sensed AV Delay:  225-185

## 2010-10-21 ENCOUNTER — Encounter: Payer: Self-pay | Admitting: Internal Medicine

## 2010-10-21 DIAGNOSIS — I442 Atrioventricular block, complete: Secondary | ICD-10-CM

## 2010-11-18 NOTE — Cardiovascular Report (Signed)
Summary: TTM   TTM   Imported By: Roderic Ovens 11/10/2010 14:42:44  _____________________________________________________________________  External Attachment:    Type:   Image     Comment:   External Document

## 2010-11-24 ENCOUNTER — Telehealth: Payer: Self-pay | Admitting: *Deleted

## 2010-11-30 NOTE — Progress Notes (Signed)
Summary: Device Check  Phone Note Call from Patient Call back at Home Phone [phone]   Caller: Self Call For: Derek [name] Summary of Call: Pt got a letter that he is due for a device check with Derek [name] and would like to know if this is necessary. Initial call taken by: Harlon Flor,  November 24, 2010 9:53 AM  Follow-up for Phone Call        Spoke with wife and explained that we usually have device patient's see the implanting md yearly, she was in agreement and will call the [place] office to schedule an appt. with Dr.Klein for June. Follow-up by: Altha Harm, LPN,  November 26, 2010 3:26 PM

## 2010-12-14 ENCOUNTER — Ambulatory Visit: Admitting: Endocrinology

## 2011-01-20 ENCOUNTER — Encounter: Payer: Self-pay | Admitting: Internal Medicine

## 2011-01-20 DIAGNOSIS — I442 Atrioventricular block, complete: Secondary | ICD-10-CM

## 2011-01-25 NOTE — Discharge Summary (Signed)
NAME:  Derek [name], AMISON NO.:  [id number]   MEDICAL RECORD NO.:  [id number]          PATIENT TYPE:  INP   LOCATION:  4730                         FACILITY:  [facility]   PHYSICIAN:  Nicki Guadalajara, M.D.     DATE OF BIRTH:  [birth year]   DATE OF ADMISSION:  03/17/2007  DATE OF DISCHARGE:  03/29/2007                               DISCHARGE SUMMARY   DISCHARGE DIAGNOSES:  1. Complete heart block on admission, permanent pacemaker implant March 23, 2007.  2. Moderate coronary disease, prior percutaneous coronary intervention      in [place] with 60% right coronary artery catheterization this      admission with normal left ventricular function.  3. Escherichia coli urosepsis this admission.  4. Prostate cancer, Foley is left in at discharge.  The patient is to      follow up with his urologist in [place].  5. 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  6. Treated hypertension.  7. Guaiac-positive stools this admission with anemia, nonsteroidals      and Plavix held.  8. Treated dyslipidemia.   HOSPITAL COURSE:  Derek [name] is an 77 year old male from [place] who  was transferred to [facility] from [facility] because of  arrhythmia.  He had complete heart block with a rate of 38-45.  He was  initially admitted through the emergency room for nausea and vomiting.  According to the family, the patient had a left hip surgery back in  October 2007, and since then has had intermittent confusion and some  agitation at home.  The patient was admitted by Dr. Elsie Lincoln.  He was  taken to the catheterization lab for insertion of temporary pacemaker  and diagnostic catheterization.  Temporary pacer was inserted.  Catheterization revealed a patent left main,  patent LAD, patent  circumflex and a 60% RCA with an EF of 60%.  Apparently, a Foley was  attempted in  [place], but unsuccessful.  The patient was seen by  urology here in [place].  The patient does have a history of prostate  cancer and had prostatectomy in 1993, and Lupron in 2005.  He is  followed by Dr. Orson Slick in [place].  A Foley was placed on July 5, and  it was suggested by the urologist that this stay in until the patient  sees Dr. Orson Slick in followup.  The patient was confused in the CCU.  He  was watched carefully over the weekend.  He did develop a fever and  initial blood culture and urine culture showed E. coli sepsis.  The  patient was started on antibiotics.  He was followed by the hospitalist  service.  The patient was going to require a permanent pacemaker, but  with E. coli sepsis, it was decided to keep him on antibiotics for a few  days prior to this.  He was seen in consult by the ID service.  He also  had some coffee-ground  emesis on admission with anemia and guaiac-  positive stools.  He was seen in consult by the GI service.  PPI was  added.  EGD was suggested.  The patient was on Plavix prior to admission  and this was discontinued.  He may need further GI workup as an  outpatient.  Followup blood cultures and urine culture were negative on  March 20, 2007.  A permanent pacemaker was implanted by Dr. Aleen Campi on  March 23, 2007.  It was a Engineer, water. Jude device.  He does have a dual chamber  device in.  He tolerated this well.  He is DDDR.  After his pacer  implant, he gradually improved as far as his confusion is concerned.  He  was transferred to the floor.  The antibiotics were changed to p.o.  Will give him a 14-day course total of antibiotics.  We feel he can be  discharged on March 29, 2007.   DISCHARGE MEDICATIONS:  1. Lovastatin 40 mg a day.  2. Baby aspirin 81 mg a day.  3. Avapro 150 mg a day.  4. Protonix 40 mg a day.  5. Norvasc 10 mg a day.  6. Levaquin 250 mg once a day for the next 2 days.  7. Multivitamin daily.   LABORATORY DATA AND X-RAY  FINDINGS:  White count 8.4, hemoglobin 11.7,  hematocrit 35.4, platelets 242.  Sodium 142, potassium 3.8, BUN 18,  creatinine 1.1, magnesium 2.2.  Urine culture on July 9, showed no  growth.  Blood culture on July 8, showed no growth.  Blood culture on  July 5, showed one of two growing E. coli.  Urine culture on July 5,  showed E. coli sensitive to levofloxacin and penicillin.  TSH was 2.16.  INR on admission was 1.1.   Chest x-ray on July 12, shows a pacemaker well-positioned with no active  disease.  Telemetry is paced.   CONDITION ON DISCHARGE:  The patient is discharged in stable condition.  His confusion apparently has cleared.  For now, we are going to hold off  on his nonsteroidals and Plavix.   FOLLOW UP:  He will follow up with his doctor in [place], Dr. Dewayne Hatch, next week to see if he can have his Foley removed.  Dr. Tresa Endo  will see him in the office in [facility] in a couple of weeks.  His  pacemaker site is fine at discharge.  He needs to see Dr. Jenne Campus for a  pacer check in about 3 months.      Derek [name], P.A.    ______________________________  Nicki Guadalajara, M.D.    Lenard Lance  D:  03/29/2007  T:  03/30/2007  Job:  [id number]   cc:   Dewayne Hatch, M.D.  Alan Mulder, M.D.

## 2011-01-25 NOTE — Cardiovascular Report (Signed)
NAMEELKIN, BELFIELD NO.:  [id number]   MEDICAL RECORD NO.:  [id number]          PATIENT TYPE:  INP   LOCATION:  2305                         FACILITY:  [facility]   PHYSICIAN:  Madaline Savage, M.D.DATE OF BIRTH:  10/12/1916   DATE OF PROCEDURE:  DATE OF DISCHARGE:                            CARDIAC CATHETERIZATION   PROCEDURES PERFORMED:  1. Selective coronary angiography by Judkins technique.  2. Retrograde left heart catheterization.  3. Left ventricular angiography.  4. Temporary transvenous pacemaker implantation into the RV apex by      way of right femoral vein.   COMPLICATIONS:  None.   ENTRY SITE:  Right femoral.   DYE USED:  Omnipaque.   PATIENT PROFILE:  The patient is an 77 year old gentleman who has had 3-  4 days of increasing disorientation debilitation and lassitude who has  presented to [facility] with these complaints, seen by  the emergency room physician there and triaged to [facility] for  complete heart block with ventricular response rates of approximately 45  per minute.  The patient entered our surgical ICU here, and was seen and  evaluated and brought to the cath lab semielectively for cardiac  catheterization and temporary pacemaker implantation with an eye towards  permanent pacemaker implantation on Monday.  There were no  complications.  The case went well.  No interventions were performed.   RESULTS:  Pressures:  Left ventricular pressure 150/23, end-diastolic  pressure 55, central aortic pressure 150/55, mean of 90, no aortic valve  gradient by pullback technique.   ANGIOGRAPHIC RESULTS:  The patient's left main coronary artery was  normal.  LAD coursed to the cardiac apex giving rise to two diagonal  branches along the way and one septal perforator branch.  No lesions  were seen in the entirety of the LAD system.   Left circumflex coronary artery gave rise to a bifurcating obtuse  marginal branch.  The  second obtuse marginal branch was single, and both  obtuse marginal branches were normal.  Circumflex itself was normal.   Right coronary artery was a dominant vessel giving rise to posterior  descending branch of medium size.  There was a 60% lesion in the mid  right coronary artery just beyond a second acute marginal branch.  It  was smooth, intracoronary artery nitroglycerin did not affect the  appearance of the vessel at being 60%.   Left ventricular angiogram showed normal contractility of all wall  segments.  Ejection fraction 60%.  Angio-Seal closure was done on the  right femoral artery and excellent hemostasis was obtained.   A percutaneous transvenous temporary pacemaker was inserted into the RV  apex.  I was careful to ensure that the tip was near the RV apex pacing  thresholds showed that the pacing threshold was approximately 3 mA.  We  left it a 5 mA at a rate of 60.   FINAL DIAGNOSES:  1. Recent confusion and change in mental status being brought about by      complete heart block.  2. Temporary transvenous pacemaker implantation done on Saturday,  September 16, 2006, with a permanent pacemaker implantation      tentatively planned for Monday, September 18, 2006.  3. Mild single-vessel coronary disease of the mid-right coronary      artery.  4. Normal LV systolic function.           ______________________________  Madaline Savage, M.D.     WHG/MEDQ  D:  03/17/2007  T:  03/17/2007  Job:  [id number]   cc:   Dani Gobble, MD  [facility]

## 2011-01-25 NOTE — Consult Note (Signed)
Derek [name], Derek [name]                ACCOUNT NO.:  [id number]   MEDICAL RECORD NO.:  [id number]          PATIENT TYPE:  INP   LOCATION:  2305                         FACILITY:  [facility]   PHYSICIAN:  Mobolaji B. Bakare, M.D.DATE OF BIRTH:  [birth year]   DATE OF CONSULTATION:  03/17/2007  DATE OF DISCHARGE:                                 CONSULTATION   PRIMARY CARE PHYSICIAN:  Unassigned.   REASON FOR CONSULTATION:  Nausea, vomiting, and fever.   HISTORY OF PRESENT ILLNESS:  Derek [name] is an 77 year old Caucasian male  who resides at home with his wife. He was in stable state of health  until yesterday, when the patient started vomiting. He had about 2  episodes of vomiting yesterday and one this morning. Two of the last  episodes were coffee ground emesis. He did not complain of abdominal  pain. Last bowel movement was yesterday but not much according to his  wife. It is also noted that he has been having confusion, such that he  could not recognize her and family members and he became agitated  yesterday. The patient's O2 saturations were in the mid 70's on 4 liters  upon arrival.   He was taken to the emergency room at [facility].  Over at [facility], he had bradycardia with second degree to third degree  heart block. He was then transferred over to [facility] to the  cardiology service. Of note, is that his blood pressure in [place] was  over 200 systolic and on arrival here, his blood pressure was still  elevated. Hence, the patient has been started on nitroglycerin infusion.  He has received temporary pacemaker.   The patient saw his primary care physician in [place] about 2 weeks  ago and the family reported that he was started on Aricept and Cozaar  for dementia and uncontrolled hypertension, respectively. He only  started these medications yesterday.   He fell about 2 days ago and since then, he has been complaining of pain  in his thigh and  back. The patient has been unable to walk. Of note, he  had left hip partial replacement in 2007 and since then, he has  deteriorated such that he is not really ambulatory.   REVIEW OF SYSTEMS:  Obtained from the patients' wife. He has been  coughing and having shortness of breath. There has not been straining at  micturition or complaint of dysuria. The patient has not had diarrhea.  He has occasional headaches.   PAST MEDICAL HISTORY:  1. Diabetes mellitus.  2. Hypertension.  3. Hyperlipidemia.  4. Coronary artery disease status post angioplasty in 2003.  5. Prostate cancer status post surgery and Lupron shots.   PAST SURGICAL HISTORY:  Back surgery, cholecystectomy, cataract surgery,  left hip partial replacement in 2007.   CURRENT MEDICATIONS:  Medications prior to admission were:  1. Hydrochlorothiazide 1 daily.  2. Lovastatin 40 mg daily.  3. Metaglip 2.5/250 mg two tablet daily.  4. Celebrex 200 mg 2 tablets daily.  5. Plavix 75 mg daily.  6. Aspirin 81 mg daily.  7. Vitamin D with calcium.  8. Cozaar, dose is unknown.  9. Aricept 10 mg daily.   ALLERGIES:  PENICILLIN (causes hives.)   FAMILY HISTORY:  Both parents passed away from old age. Father had  intestinal perforation before he passed away.   SOCIAL HISTORY:  He smoked a long time ago, for about 5 years. He  occasionally drinks alcohol. The patient is not independent at all.   PHYSICAL EXAMINATION:  VITAL SIGNS:  Temperature 102, blood pressure  155/95, pulse of 60. Respiratory rate 20. O2 saturations 97% on non-  rebreather.  GENERAL:  The patient is dyspneic. He only responds to painful stimuli.  HEENT:  He has opaque right eye. Left eye pupil is round and small.  Pupil size 2 to 3 mm. He has no elevated JVD. No oral thrush.  LUNGS:  Bibasilar crackles, right more than left.  CARDIOVASCULAR:  S1 and S2, regular.  ABDOMEN:  Is obese, soft, nontender. Bowel sounds present. No palpable  organomegaly.   EXTREMITIES:  There is early responses palpable bilaterally. No  peripheral cyanosis.  NEUROLOGIC:  Unable to examine at this point. The patient is unable to  cooperate. He is arousable but is easily returns back to sleep. He moves  all of his extremities.   LABORATORY DATA:  None available at this point. However, did obtain from  [facility], showed white cell count of 10.1. Hemoglobin and  hematocrit 11.8 and 26.1. Platelets 231,000. Differential 86%  neutrophils.   Chest x-ray shows cardiomegaly with bilateral edema or infiltrates,  pacing leads to towards the right ventricular apex.   IMPRESSION/RECOMMENDATIONS:  1. Febrile illness. The patient has a temperature of 102. He has      history of cough and shortness of breath prior to hospitalization.      White cell count is elevated at 12,000. Chest x-ray shows bilateral      edema or infiltrates. The patient probably has pneumonia. Will also      exclude urinary tract infection. Obtain 2 sets of blood cultures      and send urine for culture and urinalysis. Chest liver function      tests. Will start broad spectrum antibiotics with vancomycin and      Levaquin. The patient is allergic to Penicillin with hives.  2. Altered mental status. This is likely secondary to the febrile. CT      scan done at [facility] showed no acute      abnormalities. There was no mass or hemorrhage. Will treat      underlying etiology. Will check ammonia level. He does not have any      focal deficits to suggest stroke.  3. Hypoxia requiring 100% oxygen. Check BNP, D-dimer, and ABG. Will      continue with oxygen and continue treatment for bilateral      pneumonia.  4. Nausea and vomiting with coffee ground emesis. This may be a      Mallory-Weiss tear. Would check hemoglobin and hematocrit q.4      hours. Confirm with Gastroccult and hemoccult. He may need GI      consult for upper endoscopy. Start Protonix 40 mg IV q.12 hours.       Zofran 4 mg IV q.4 hours prn. Will avoid Lovenox for DVT      prophylaxis at this point and give sequential compression device.  5. Recent fall with left thigh and back pain. Will need to rule out  fracture. Obtain pelvic x-ray and lumbosacral x-ray.  6. History of prostate cancer. The family reported recent elevation in      PSA. He has an appointment to followup with his urologist in      August.  7. Diabetes mellitus. The patient is unable to take p.o. now. Was      unable to take orally now with old oral hypoglycemic agents. Place      on sliding scale insulin and continue sliding scale insulin with      NovoLog q.4 hours.  8. Complete heart block. He has a temporary pacer now.  9. Hypertension. Continue nitroglycerin infusion per primary team.  10.CODE STATUS:  The wife stated that he would not like to be on a      ventilator. The patient has a Living Will to this effect.      Mobolaji B. Corky Downs, M.D.  Electronically Signed     MBB/MEDQ  D:  03/17/2007  T:  03/17/2007  Job:  [id number]   cc:   Madaline Savage, M.D.

## 2011-01-25 NOTE — Op Note (Signed)
NAMECALEB, DECOCK NO.:  [id number]   MEDICAL RECORD NO.:  [id number]          PATIENT TYPE:  INP   LOCATION:  2909                         FACILITY:  [facility]   PHYSICIAN:  Francisca December, M.D.  DATE OF BIRTH:  October 15, 1916   DATE OF PROCEDURE:  03/23/2007  DATE OF DISCHARGE:                               OPERATIVE REPORT   PROCEDURES PERFORMED:  1. Insertion dual chamber permanent transvenous pacemaker.  2. Left subclavian venogram.   INDICATION:  Mr. Derek [name] is an 77 year old man who presented to  [facility] approximately one week ago with urosepsis and  complete heart block.  The underlying heart rate was 35-40 beats per  minute.  He has been treated with intravenous antibiotics for six days  and has had a temporary transvenous pacemaker via the right groin in  place for the same amount of time.  He is brought to the catheterization  laboratory at this time for insertion of a dual chamber permanent  transvenous pacemaker.   PROCEDURE NOTE:  The patient was brought to the cardiac catheterization  laboratory in a fasting state.  The left prepectoral region was prepped  and draped in the usual sterile fashion.  Local anesthesia was obtained  with infiltration of 1% lidocaine with epinephrine throughout the left  prepectoral region.  A left subclavian venogram was then performed with  a peripheral injection of 20 mL of Omnipaque.  A digital cineangiogram  was obtained in the AP projection and road mapped to guide future left  subclavian puncture.  The venogram did demonstrate the subclavian vein  to be widely patent and coursing in a normal fashion over the anterior  surface of the first rib and beneath the middle third of the clavicle.  There was no evidence for persistence of the left superior vena cava.   A 6-7 cm incision was then made in the deltopectoral groove and this was  carried down by sharp dissection and electrocautery to the  prepectoral  fascia.  There, a pocket was performed inferiorly and medially using  blunt dissection and electrocautery.  The pocket was then packed with 1%  kanamycin soaked gauze.  Two separate left subclavian punctures were  performed with an 18 gauge thin wall needle.  Two 0.035 inch guidewires  were placed.  Over the initial guidewire, a 7-French tearaway sheath and  dilator was advanced.  The dilator and wire were removed and the  ventricular lead was advanced to the level of the right atrium.  The  sheath was torn away.  Using standard technique and fluoroscopic  landmarks, the lead was manipulated onto the right ventricular septal  wall.  This was an active fixation lead and the screw advanced as  appropriate.  It was then tested for adequate pacing parameters and  these are reported below.  It was tested for diaphragmatic pacing at 10  volts and none was found.  The lead was then sutured into place using  three separate 0 silk ligatures.  There was an injury current of  approximately 10 mV after advancing the screw  on this lead.  Over the  remaining guidewire, a second 7-French tearaway sheath and dilator were  advanced.  The dilator and wire were removed and the atrial lead was  advanced to the level of the right atrium.  The sheath was torn away.  Using standard technique and fluoroscopic landmarks, the lead was  manipulated into the right atrial appendage.  This was also an active  fixation lead and the screw was advanced as appropriate.  It was then  tested for adequate pacing parameters and these are reported below.  It  was tested for diaphragmatic pacing at 10 volts and none was found.  The  lead was then sutured into place using three separate 0 silk ligatures.  The kanamycin soaked gauze was then removed from the pocket.  The pocket  was copiously irrigated using 1% kanamycin solution.  The leads were  then attached to the pacing generator carefully identifying each by  its  serial number and placing each into the appropriate receptacle.  Each  lead was tightened into place and tested for security.  The leads were  then wound beneath the pacing generator and the generator was placed in  the pocket.  An 0 silk anchoring suture was applied.  The pocket was  then inspected for bleeding and none was found.  The pocket was then  closed using 2-0 Vicryl in a running fashion for the subcutaneous layer.  The skin was approximated using 4-0 Vicryl in a running subcuticular  fashion.  Steri-Strips and a sterile dressing were applied.  The patient  was transported to the recovery area in stable condition in A sense V  pace mode.   EQUIPMENT DATA:  The pacing generator is a St. Jude Zephyr XL DR, model  number [id number], serial number [id number].  The ventricular lead is a St. Jude  model number [id number], serial number [id number].  The atrial lead is a St.  Jude model [id number], serial number [id number].   PACING DATA:  The ventricular lead detected a 21 mV R-wave.  The pacing  threshold was 1 volt at 0.5 milliseconds pulse width.  The impedance was  790 ohms.  The atrial lead detected 3 mV P-wave.  The pacing threshold  was 0.7 volts at 0.5 milliseconds pulse width.  The impedance was 546  ohms.      Francisca December, M.D.  Electronically Signed     JHE/MEDQ  D:  03/23/2007  T:  03/24/2007  Job:  [id number]   cc:   Madaline Savage, M.D.

## 2011-01-25 NOTE — H&P (Signed)
NAME:  Derek [name], Derek Derek [name].:  [id number]   MEDICAL RECORD [name].:  [id number]          PATIENT TYPE:  INP   LOCATION:  2305                         FACILITY:  [facility]   PHYSICIAN:  Derek Derek [name], M.D.DATE OF BIRTH:  07/03/1917   DATE OF ADMISSION:  03/17/2007  DATE OF DISCHARGE:                              HISTORY & PHYSICAL   Derek Derek [name] is an 77 year old white married male patient who was  transferred from [facility] because of arrhythmia.  He was in  complete heart block with a rate in the 38-45.  The patient also has had  mental status changes and is unable to give any history.  The H&P is  derived from the family and review of ER records from [place].  Mr.  Derek [name] apparently had an open reduction and internal fixation of his  left hip back in October 2007.  Since that time he has had slow mental  status changes.  He has now been diagnosed with some dementia and  recently put on Aricept by his primary care doctor.  However, over the  last week his mental status has rapidly progressed.  The family states  that he has become agitated.  He does not know the numbers to  television, etc.  He has been getting up at strange times of the night  and, in fact, got up about 1 a.m. this past Thursday and got dressed and  then fell down.  He has also had problems with decreased appetite.  He  has not been eating very much and he has had a coffee-grounds emesis in  the last couple of days.  He also had some vomiting apparently at  [facility] ER.   Prior medical history includes:  1. NIDDM.  2. Hypertension.  3. Dementia.  4. Prostatectomy about 8 years ago with recent elevated PSA at 33.      Has an appointment to see his urologist in August.  He was      previously on Lupron injections.  5. Left partial hip replacement.  6. Cholecystectomy.  7. Hyperlipidemia.  8. History of back surgery.  9. History of coronary artery disease.  Has seen Dr. Welton Flakes in the past.    He had an angioplasty about 5 years ago.  Last stress test was      about 2 years ago.  10.He had bilateral cataract surgery.  The right eye lens implant      failed and, in fact, he has [name] vision now on his right eye.   Medications, dosages unknown:  Plavix 75 mg a day, Metaglip, lovastatin,  Ultram and Cozaar.   At [facility] he received:  1. Lasix 20 mg IV.  2. Atropine 0.5 mg IV.  3. Nitroglycerin spray.  4. Clonidine 0.1 mg.  5. Zofran IV 4 mg.   ALLERGIES:  PENICILLIN, rash.   FAMILY HISTORY:  Not necessary.   SOCIAL HISTORY:  Married, positive children.  He usually used to use a  cane.  For about 2 weeks now he has needed to use a walker.   REVIEW OF  SYSTEMS:  Positive increased shortness of breath.  Recent  positive lower extremity edema.  He has been sleepy for the last several  days.  Left leg pain x3 weeks and decreased appetite.  Denies any chest  pain, [name] presyncope, [name] syncope.  [name] known complaints of dizziness.  [name]  known complaints of indigestion, heartburn, abdominal pain.  Other  systems are negative.   PHYSICAL EXAMINATION:  VITAL SIGNS:  BP 193/39, heart rate 46.  He is  afebrile.  Labs from [place] show a sodium of 141, potassium 4.3, BUN 22,  creatinine 1.2, and glucose 175, CK-MB 93/0.9, troponin is less than  0.1.  His INR is 1.  His hemoglobin was 11.8, hematocrit 36.1, platelets  231, and WBC 12.1.  He had a head CT at [facility] that showed [name] bleed,  [name] acute changes.  He had atrophy and microvascular disease.  Generally, he is a sleepy.  He can answer some questions but then falls  asleep or chooses then not to answer any questions.  He was able to tell  me his name.  He was not able to tell me his date of birth.  ENT:  Right eye is cloudy.  Sclera is clear on the left.  RESPIRATIONS:  Crackles bilaterally in the bases.  CARDIOVASCULAR:  Heart sounds are regular.  He is a musical murmur,  which has a high pitch.  He has [name] JVD.  He has a  positive carotid  bruit, which might be a transmitted murmur.  GI:  Bowel sounds are very decreased.  Abdomen is firm and tender,  especially in the right and left lower border quadrant.  Lower extremity has positive edema, 2+ to his feet and ankles, 1+ in on  his shins.  He has good dorsalis pedis pulses.  NEUROLOGIC:  He is sleepy, not alert unless stimulated.  He is not  oriented.  Strength is equal bilateral.  Facial symmetry is equal.   ASSESSMENT:  1. Complete heart block.  2. Hypertension.  3. Mental status changes progressively quick over the last week with      decline in his mental capabilities over the last several month.  4. Coffee-ground emesis.  5. Valvular heart disease.  6. Noninsulin-dependent diabetes mellitus.  7. Recent elevated PSA of 33 and status post prostatectomy, previously      on Lupron injections.  8. Coronary artery disease.  He never had any symptoms of chest pain      before his angioplasty and [name] other symptomatic complaints of      shortness of breath, etc.  He was just all of a sudden unable to do      anything.   PLAN:  He was seen by Dr. Elsie Lincoln.  He has decided to take him to the  catheterization lab to put a temporary pacemaker in, to also look at his  coronary arteries.  The risks and benefits were explained to the family,  including MI, death and stroke.  The family is willing to proceed.  They  understand he might not survive this hospitalization.      [name], N.P.    ______________________________  Derek Derek [name], M.D.    BB/MEDQ  D:  03/17/2007  T:  03/17/2007  Job:  [id number]   cc:   Alan Mulder, M.D.  Dr. Orson Slick  Dr. Welton Flakes

## 2011-01-25 NOTE — H&P (Signed)
NAMEKARMELO, BASS NO.:  [id number]   MEDICAL RECORD NO.:  [id number]          PATIENT TYPE:  INP   LOCATION:  2305                         FACILITY:  [facility]   PHYSICIAN:  Madaline Savage, M.D.DATE OF BIRTH:  August 28, 1917   DATE OF ADMISSION:  03/17/2007  DATE OF DISCHARGE:                              HISTORY & PHYSICAL   HISTORY OF PRESENT ILLNESS:  The patient is an   Dictation ended at this point.           ______________________________  Madaline Savage, M.D.     WHG/MEDQ  D:  03/17/2007  T:  03/17/2007  Job:  [id number]

## 2011-01-28 NOTE — Op Note (Signed)
[place]. [facility]  Patient:    Derek [name], Derek [name]                       MRN: [id number] Proc. Date: 04/12/00 Adm. Date:  [id number] Attending:  Jackelyn Knife                           Operative Report  PREOPERATIVE DIAGNOSES:  Spinal stenosis, L3-4 to L4-5, bilateral; disk herniation L3-4 right; foraminal stenosis right L3-4, right L4-5.  POSTOPERATIVE DIAGNOSES:  Spinal stenosis, L3-4 to L4-5, bilateral, disk herniation L3-4 right, foraminal stenosis right L3-4, right L4-5.  PROCEDURE:  Reexploration and decompression, L3-4, L4-5; diskectomy L3-4 right; foraminotomies L3-4 right, L4-5 right.  SURGEON:  Izell Ferguson. Elesa Hacker, M.D.  ASSISTANT:  Clydene Fake, M.D.  INDICATIONS:  Derek [name] is an 77 year old man in good general health, who over the last several months had developed a claudication-type syndrome that has worsened and has almost incapacitated him.  Reevaluation included an MRI study, which demonstrated recurrent spinal stenosis at L3-4 and to some extent extending to L4-5.  He had been previously operated on a few years ago for similar problems, and this has recurred.  He is admitted for the decompression procedure described below.  DESCRIPTION OF PROCEDURE:  Under general endotracheal anesthesia, this man was positioned prone over laminectomy cushions with care taken to prevent pressure points.  The previous incision was reopened, and positive identification of the proper operative level was carried out with x-ray assistance.  Using the high-speed drill and various curettes and rongeurs, the previously made decompressive laminectomy was reexposed and reopened enlarged.  This required removal of some additional bone of the L3 spinous process and lamina and then extending the decompression inferiorly from there.  A good deal of scar tissue was encountered and removed.  The disk at L3-4 on the right side was entered and a large quantity of  degenerative material was removed. Foraminotomies were done on the right at L3-4 and L4-5 and to some extent bilaterally.  The wound was copiously irrigated and hemostasis was obtained.  The wound was then closed in layers and a sterile dressing applied.  The patient was taken to recovery in good condition. DD:  04/12/00 TD:  04/13/00 Job: [id number]

## 2011-01-28 NOTE — Discharge Summary (Signed)
[place]. [facility]  Patient:    EL, PILE                       MRN: [id number] Adm. Date:  [id number] Disc. Date: [id number] Attending:  Jackelyn Knife                           Discharge Summary  HISTORY OF PRESENT ILLNESS:  Mr. Romaldo Saville is an 77 year old man who presented with back and leg pain involving both legs, but recently worse on the left than the right.  His general health was quite good.  His workup included a lumbosacral MRI study which demonstrated significant spinal stenosis and foraminal stenosis at L3-4 and L4-5.  HOSPITAL COURSE:  After an appropriate preoperative workup, including an explanation of the goals and risks of the procedure, he was taken to the operating room on April 12, 2000.  He underwent re-exploration and decompression at L3-4 and L4-5 bilaterally.  Operative findings were consistent with the MRI preoperative diagnosis.  He did well and was able to be discharged on the second postoperative day. His incision was healing well at the time of discharge.  FINAL DIAGNOSIS:  Spinal and foraminal stenosis L3-4 and L4-5.  CONDITION ON DISCHARGE:  Improving.  DISCHARGE INSTRUCTIONS: 1. Up ad lib with a 15-pound weightlifting restriction for four weeks.  He is    to walk frequently throughout the day with rest periods in between. 2. Regular diet. 3. Back to see me in about nine days. 4. Discharge medications:  He is to take his regular medications plus Vicodin    ES on a p.r.n. basis for pain.  He was cautioned to take a laxative since    the narcotic would cause constipation. DD:  04/14/00 TD:  04/16/00 Job: [id number]

## 2011-01-28 NOTE — H&P (Signed)
[place]. [facility]  Patient:    Derek, [name]                      MRN: [id number] Adm. Date:  04/12/00 Attending:  Izell Charles. Elesa Hacker, M.D.                         History and Physical  HISTORY:  Mr. Derek [name]. [name] is an 77 year old male who presents with a chief complaint of increased and severe low back pain over the last two months or so.  He has had radiation of pain from his low back to both of his legs, but more so on the left side.  He describes pain every day and notes that it is getting worse.  He is relatively comfortable when he is sitting or if he is off his feet lying down.  As soon as he gets up, he is immediately back into difficulty, with significant back and leg pain as described.  Bowel and bladder function are unaffected and are essentially within normal limits.  This man had an outside MRI study done which shows severe stenosis at L3-L4 caused by a large disk herniation in conjucntion with ______ hypertrophy and ligamentous thickening.  PAST MEDICAL HISTORY:  Shows he has high blood pressure, is currently being treated.  He is a diabetic and taking oral medication for this.  He had prostate cancer and was treated with surgery and radiation some years ago.  FAMILY HISTORY:  Shows his mother died at age 64 of "old age."  His father died at age 66 of "low blood pressure."  He had a 78 year old brother who died of a heart attack.  His 55 year old child is living that has back problems, and a 42 year old child with high blood pressure and obesity.  PREVIOUS SURGERY:  Surgery of the prostate carcinoma done in 1992.  He had a cholecystectomy in 1993 and a lumbar laminectomy at L3-4 done by Dr. Gerlene Fee. He had eye surgery in 1997.  CURRENT MEDICATIONS:  Consist of Celebrex, captopril, Glyset, Zocor, eye drops.  ALLERGIES:  PENICILLIN.  PERSONAL HISTORY:  Shows he is married and retired, and does not smoke or use alcohol.  REVIEW  OF SYSTEMS:  He indicated he has had some weakness and fatigue, probably related to his pain.  He had jaundice in 1943, when he was in [place] in the [employer].  PHYSICAL EXAMINATION:  GENERAL:  He is alert and cooperative.  VITAL SIGNS:  He weighs 192 at 5 feet 10.5 inches tall.  His blood pressure is 196/108 with a pulse of 59.  He is afebrile.  HEENT:  Within normal limits.  NECK:  He has no cervical or supraclavicular adenopathy or bruits.  CHEST:  Clear bilaterally.  CARDIAC:  Reveals no murmurs.  ABDOMEN:  Soft and nontender with no palpable masses.  NEUROLOGIC:  Reveals sensorium and cranial nerves to be intact.  Range of motion with lumbosacral spine is limited on forward bending and side-to-side tilting with increased back and leg pain.  He has a well-healed midline lumbar incision.  Straight leg raising is negative bilaterally.  Deep tendon reflexes are hypoactive throughout.  He has some slight dorsiflexor weakness on the left, particularly the great toe.  He has some minimal sensory deficit on the left side, around the pretibial area and dorsum of the foot.  IMPRESSION:  Severe lumbar spinal stenosis and disk herniation at L3-4.  RECOMMENDATIONS:  Surgical decompression at L3-4 has been recommended.  A careful explanation of this procedure was carried out with the patient and with his wife.  He understands the risks include hemorrhage, infection, damage to the neural elements with sensory loss and loss of motor function.  He understands there is a risk of anesthesia. DD:  04/12/00 TD:  04/12/00 Job: [id number]

## 2011-02-22 ENCOUNTER — Encounter: Payer: Self-pay | Admitting: Internal Medicine

## 2011-02-22 ENCOUNTER — Emergency Department: Admitting: Emergency Medicine

## 2011-02-22 DIAGNOSIS — I10 Essential (primary) hypertension: Secondary | ICD-10-CM | POA: Insufficient documentation

## 2011-02-28 ENCOUNTER — Ambulatory Visit (INDEPENDENT_AMBULATORY_CARE_PROVIDER_SITE_OTHER): Payer: Medicare Other | Admitting: Internal Medicine

## 2011-02-28 ENCOUNTER — Encounter: Payer: Self-pay | Admitting: Internal Medicine

## 2011-02-28 DIAGNOSIS — Z950 Presence of cardiac pacemaker: Secondary | ICD-10-CM

## 2011-02-28 DIAGNOSIS — IMO0001 Reserved for inherently not codable concepts without codable children: Secondary | ICD-10-CM

## 2011-02-28 DIAGNOSIS — I442 Atrioventricular block, complete: Secondary | ICD-10-CM

## 2011-02-28 DIAGNOSIS — M791 Myalgia, unspecified site: Secondary | ICD-10-CM | POA: Insufficient documentation

## 2011-02-28 DIAGNOSIS — I2510 Atherosclerotic heart disease of native coronary artery without angina pectoris: Secondary | ICD-10-CM

## 2011-02-28 MED ORDER — FUROSEMIDE 40 MG PO TABS: 40.0000 mg | ORAL_TABLET | ORAL | Status: DC | PRN

## 2011-02-28 MED ORDER — FUROSEMIDE 40 MG PO TABS: 40.0000 mg | ORAL_TABLET | Freq: Every day | ORAL | Status: DC

## 2011-02-28 NOTE — Assessment & Plan Note (Signed)
This may be attributable to his statin. I will plan to stop it. When he sees Dr. Knute Neu in a couple of months this can be reviewed as to whether low-dose for intermittent statin therapy might be of benefit

## 2011-02-28 NOTE — Assessment & Plan Note (Signed)
The patient's device was interrogated.  The information was reviewed. No changes were made in the programming.    

## 2011-02-28 NOTE — Assessment & Plan Note (Signed)
No attributable symptoms. We do not have records that I can find related to angioplasty versus stenting and I wonder whether it's not time to stop his Plavix.  Will have him come back and see Dr. Knute Neu to discuss this

## 2011-02-28 NOTE — Progress Notes (Signed)
  HPI  Derek [name] is a 77 y.o. male Seen in followup for a pacemaker implantation for complete heart block and 2008. I catheterization at that time he had normal coronary arteries normal systolic function.  His biggest complaint is aches and weakness of his legs.    He does not recall ever having had a stent. There is a record of an angioplasty in 2003.    Past Medical History  Diagnosis Date  . Pacemaker   . Hypertension   . DM (diabetes mellitus)   . Urinary problem   . Arthritis   . Gout   . Cancer of prostate     Past Surgical History  Procedure Date  . Total hip arthroplasty   . Prostate surgery   . Pacemaker insertion   . Coronary angioplasty     Current Outpatient Prescriptions  Medication Sig Dispense Refill  . aspirin 81 MG EC tablet Take 81 mg by mouth daily.        Marland Kitchen CALCIUM PO Take by mouth.        . Cholecalciferol (VITAMIN D3) 1000 UNITS CAPS Take 4 tablets by mouth daily.        . clopidogrel (PLAVIX) 75 MG tablet Take 75 mg by mouth daily.        . furosemide (LASIX) 40 MG tablet Take 40 mg by mouth daily.        Marland Kitchen glipizide-metformin (METAGLIP) 2.5-250 MG per tablet Take 4 tablets by mouth daily.        . irbesartan (AVAPRO) 150 MG tablet Take 150 mg by mouth daily.        Marland Kitchen lovastatin (MEVACOR) 40 MG tablet Take 40 mg by mouth at bedtime.        . Multiple Vitamin (MULTIVITAMIN) capsule Take 1 capsule by mouth daily.        . saxagliptin HCl (ONGLYZA) 5 MG TABS tablet Take 5 mg by mouth daily.        . vitamin E 400 UNIT capsule Take 800 Units by mouth daily.          Allergies  Allergen Reactions  . Penicillins     Review of Systems negative except from HPI and PMH  Physical Exam Well developed and well nourished in no acute distress HENT normal  Neck Supple JVP flat; carotids brisk and full Clear to ausculation Regular rate and rhythm, decreased heart sounds and a 1-2/6 high-pitched murmur at the apex Soft with active bowel sounds No  clubbing cyanosis; bilateral 1+ peripheral edema Alert and oriented, uses awalker and a wide-based gait Skin Warm and Dry    Assessment and  Plan

## 2011-02-28 NOTE — Patient Instructions (Addendum)
Your physician has recommended you make the following change in your medication: STOP Lovastatin.  Continue taking Furosemide daily, and also take1 tablet once AS NEEDED for increased swelling for 1-2 times a week.  Your physician recommends that you schedule a follow-up appointment in: 3 months with Dr. Mariah Milling           And 1 year f/u with Dr. Graciela Husbands

## 2011-02-28 NOTE — Assessment & Plan Note (Signed)
Stable post pacemaker implantation 100% ventricularly paced

## 2011-03-03 ENCOUNTER — Ambulatory Visit: Admitting: Urology

## 2011-03-14 ENCOUNTER — Other Ambulatory Visit: Payer: Self-pay | Admitting: *Deleted

## 2011-03-14 MED ORDER — FUROSEMIDE 40 MG PO TABS: 40.0000 mg | ORAL_TABLET | Freq: Every day | ORAL | Status: DC

## 2011-03-14 NOTE — Telephone Encounter (Signed)
Pt's wife called stating that pt's Rx was sent to wrong pharmacy. He will need 30 day supply at [facility] in [facility] and then she will call back [facility].

## 2011-04-21 ENCOUNTER — Encounter: Payer: Self-pay | Admitting: Internal Medicine

## 2011-04-21 DIAGNOSIS — I442 Atrioventricular block, complete: Secondary | ICD-10-CM

## 2011-04-28 ENCOUNTER — Other Ambulatory Visit: Payer: Self-pay | Admitting: Internal Medicine

## 2011-05-27 ENCOUNTER — Encounter: Payer: Self-pay | Admitting: Cardiovascular Disease

## 2011-05-31 ENCOUNTER — Ambulatory Visit: Payer: Medicare Other | Admitting: Cardiovascular Disease

## 2011-06-10 ENCOUNTER — Encounter: Payer: Self-pay | Admitting: Cardiovascular Disease

## 2011-06-13 ENCOUNTER — Encounter: Payer: Self-pay | Admitting: Cardiovascular Disease

## 2011-06-13 ENCOUNTER — Ambulatory Visit (INDEPENDENT_AMBULATORY_CARE_PROVIDER_SITE_OTHER): Payer: Medicare Other | Admitting: Cardiovascular Disease

## 2011-06-13 DIAGNOSIS — I359 Nonrheumatic aortic valve disorder, unspecified: Secondary | ICD-10-CM

## 2011-06-13 DIAGNOSIS — I442 Atrioventricular block, complete: Secondary | ICD-10-CM

## 2011-06-13 DIAGNOSIS — I350 Nonrheumatic aortic (valve) stenosis: Secondary | ICD-10-CM | POA: Insufficient documentation

## 2011-06-13 DIAGNOSIS — I2510 Atherosclerotic heart disease of native coronary artery without angina pectoris: Secondary | ICD-10-CM

## 2011-06-13 DIAGNOSIS — I10 Essential (primary) hypertension: Secondary | ICD-10-CM

## 2011-06-13 DIAGNOSIS — E119 Type 2 diabetes mellitus without complications: Secondary | ICD-10-CM

## 2011-06-13 NOTE — Assessment & Plan Note (Signed)
We have encouraged continued exercise, careful diet management in an effort to lose weight. 

## 2011-06-13 NOTE — Patient Instructions (Signed)
You are doing well. No medication changes were made. Please call us if you have new issues that need to be addressed before your next appt.  We will call you for a follow up Appt. In 12 months  

## 2011-06-13 NOTE — Progress Notes (Signed)
Patient ID: Derek [name], male    DOB: 08-29-1917, 77 y.o.   MRN: [id number]  HPI Comments: Derek [name] is a very pleasant 77 year old gentleman patient of Dr. Patrecia Pace, with a history of complete heart block, pacemaker placed in March 23, 2007, history of lower extremity weakness with no significant coronary artery disease by catheterization in July 2008, normal systolic function, moderate aortic valve stenosis, hypertension, lower extremity edema and hyperlipidemia who presentsfor routine follow up. He does have a history of dizziness back in August of 2011 where he could not get out of bed. Symptoms resolved without further intervention. No further episodes since that time.   he's not very active, does walk with 2 canes. His edema has improved. He is also been trying to watch his weight. He denies any significant shortness of breath, chest tightness or near syncope episodes. He has had problems with his renal function and states he had a procedure to have his "kidneys stretched" by Dr. Orson Slick. Uncertain if he had prostate issue or stricture of a ureter. Details are unavailable to [place] at this time.   He does report having an echocardiogram in April 2012 performed at [facility].This was read by Dr. Patrecia Pace. Measurements of his aortic valve indicate a valve area of 1.3 cm. Mean gradient 17 mm mercury, peak gradient 28 mm of mercury. This would suggest mild aortic valve stenosis by mean gradient, moderate by valve area.   EKG shows  rate is 60 beats per minute with left bundle Branch block          Outpatient Encounter Prescriptions as of 06/13/2011  Medication Sig Dispense Refill  . aspirin 81 MG EC tablet Take 81 mg by mouth daily.        Marland Kitchen CALCIUM PO Take by mouth.        . Cholecalciferol (VITAMIN D3) 1000 UNITS CAPS Take 4 tablets by mouth daily.        . clopidogrel (PLAVIX) 75 MG tablet Take 75 mg by mouth daily.        . furosemide (LASIX) 40 MG tablet TAKE 1 TABLET DAILY...TAKE ADDITIONAL  TABLET ONCE FOR INCREASED SWELLING 1-2 TIMES A WEEK  40 tablet  11  . insulin glargine (LANTUS) 100 UNIT/ML injection Inject 10 Units into the skin at bedtime.        . irbesartan (AVAPRO) 150 MG tablet Take 150 mg by mouth daily.        . Multiple Vitamin (MULTIVITAMIN) capsule Take 1 capsule by mouth daily.        . repaglinide (PRANDIN) 1 MG tablet Take 1 mg by mouth once.        . vitamin E 400 UNIT capsule Take 800 Units by mouth daily.          Review of Systems  Constitutional: Negative.   HENT: Negative.   Eyes: Negative.   Respiratory: Negative.   Cardiovascular: Negative.   Gastrointestinal: Negative.   Musculoskeletal: Positive for arthralgias and gait problem.  Skin: Negative.   Neurological: Negative.   Hematological: Negative.   Psychiatric/Behavioral: Negative.   All other systems reviewed and are negative.    BP 153/74  Pulse 69  Ht 5\' 8"  (1.727 m)  Wt 197 lb (89.359 kg)  BMI 29.95 kg/m2  Physical Exam  Nursing note and vitals reviewed. Constitutional: He is oriented to person, place, and time. He appears well-developed and well-nourished.  HENT:  Head: Normocephalic.  Nose: Nose normal.  Mouth/Throat: Oropharynx is clear and  moist.  Eyes: Conjunctivae are normal. Pupils are equal, round, and reactive to light.  Neck: Normal range of motion. Neck supple. No JVD present.  Cardiovascular: Normal rate, regular rhythm, S1 normal, S2 normal and intact distal pulses.  Exam reveals no gallop and no friction rub.   Murmur heard.  Crescendo systolic murmur is present with a grade of 3/6  Pulmonary/Chest: Effort normal and breath sounds normal. No respiratory distress. He has no wheezes. He has no rales. He exhibits no tenderness.  Abdominal: Soft. Bowel sounds are normal. He exhibits no distension. There is no tenderness.  Musculoskeletal: Normal range of motion. He exhibits no edema and no tenderness.  Lymphadenopathy:    He has no cervical adenopathy.    Neurological: He is alert and oriented to person, place, and time. Coordination normal.  Skin: Skin is warm and dry. No rash noted. No erythema.  Psychiatric: He has a normal mood and affect. His behavior is normal. Judgment and thought content normal.           Assessment and Plan        .

## 2011-06-13 NOTE — Assessment & Plan Note (Signed)
History of AV block, pacemaker followed by Dr. Graciela Husbands.

## 2011-06-13 NOTE — Assessment & Plan Note (Addendum)
Echocardiogram read in 2008 suggested moderate aortic valve stenosis. Mean gradient at that time was 20 mm mercury, peak gradient of 37 mmHg. Echocardiogram performed April of this year read by Dr. Nelia Shi suggested mild aortic valve stenosis. Estimated aortic valve area was more in the moderate range, 1.3 cm sq.  Gradient recently was less than 4 years ago with mean gradient 17, peak gradient 28 mm mercury. Clinical exam is concerning for significant aortic valve stenosis. I will review the echocardiogram done earlier this year at [facility]. I suggested to him that echocardiogram be performed once a year.

## 2011-06-13 NOTE — Assessment & Plan Note (Signed)
Blood pressure is elevated today but he does report reasonable blood pressures at home. We have suggested he closely monitor his blood pressure at home and contact our office if they continue to be elevated.

## 2011-06-28 LAB — BASIC METABOLIC PANEL
BUN: 18
BUN: 22
BUN: 34 — ABNORMAL HIGH
CO2: 24
CO2: 25
CO2: 26
CO2: 27
Calcium: 8.7
Calcium: 8.9
Calcium: 9
Calcium: 9.2
Chloride: 107
Chloride: 107
Creatinine, Ser: 1.19
Creatinine, Ser: 1.38
Creatinine, Ser: 1.42
GFR calc Af Amer: 36 — ABNORMAL LOW
GFR calc Af Amer: 59 — ABNORMAL LOW
GFR calc Af Amer: >60
GFR calc non Af Amer: 30 — ABNORMAL LOW
GFR calc non Af Amer: 32 — ABNORMAL LOW
GFR calc non Af Amer: 49 — ABNORMAL LOW
GFR calc non Af Amer: 58 — ABNORMAL LOW
Glucose, Bld: 118 — ABNORMAL HIGH
Glucose, Bld: 130 — ABNORMAL HIGH
Glucose, Bld: 145 — ABNORMAL HIGH
Glucose, Bld: 206 — ABNORMAL HIGH
Glucose, Bld: 215 — ABNORMAL HIGH
Glucose, Bld: 90
Potassium: 3.9
Potassium: 4.8
Sodium: 141
Sodium: 141
Sodium: 142
Sodium: 142
Sodium: 142
Sodium: 144

## 2011-06-28 LAB — COMPREHENSIVE METABOLIC PANEL
ALT: 16
Alkaline Phosphatase: 52
BUN: 23
CO2: 27
GFR calc non Af Amer: 32 — ABNORMAL LOW
Glucose, Bld: 146 — ABNORMAL HIGH
Potassium: 4.2
Sodium: 139

## 2011-06-28 LAB — HEMOGLOBIN AND HEMATOCRIT, BLOOD
HCT: 27 — ABNORMAL LOW
HCT: 31 — ABNORMAL LOW
Hemoglobin: 10.2 — ABNORMAL LOW
Hemoglobin: 9 — ABNORMAL LOW

## 2011-06-28 LAB — CBC
HCT: 29.3 — ABNORMAL LOW
HCT: 33.4 — ABNORMAL LOW
HCT: 34.2 — ABNORMAL LOW
HCT: 34.7 — ABNORMAL LOW
Hemoglobin: 11.1 — ABNORMAL LOW
Hemoglobin: 11.3 — ABNORMAL LOW
Hemoglobin: 11.5 — ABNORMAL LOW
Hemoglobin: 11.6 — ABNORMAL LOW
Hemoglobin: 11.6 — ABNORMAL LOW
Hemoglobin: 9.8 — ABNORMAL LOW
MCHC: 32.6
MCHC: 32.8
MCHC: 32.9
MCHC: 33
MCHC: 33.1
MCHC: 33.2
MCV: 79.7
MCV: 80.2
Platelets: 154
Platelets: 164
Platelets: 242
Platelets: 265
RBC: 4.18 — ABNORMAL LOW
RBC: 4.24
RBC: 4.4
RBC: 4.43
RDW: 17.3 — ABNORMAL HIGH
RDW: 17.6 — ABNORMAL HIGH
RDW: 17.8 — ABNORMAL HIGH
RDW: 18 — ABNORMAL HIGH
RDW: 18.1 — ABNORMAL HIGH
RDW: 18.2 — ABNORMAL HIGH
WBC: 17.7 — ABNORMAL HIGH

## 2011-06-28 LAB — APTT
aPTT: 29
aPTT: 31

## 2011-06-28 LAB — URINALYSIS, ROUTINE W REFLEX MICROSCOPIC
Bilirubin Urine: NEGATIVE
Glucose, UA: NEGATIVE
Protein, ur: 30 — AB
Specific Gravity, Urine: 1.016
Specific Gravity, Urine: 1.036 — ABNORMAL HIGH
Urobilinogen, UA: 1
pH: 5.5

## 2011-06-28 LAB — POCT I-STAT 3, ART BLOOD GAS (G3+)
Acid-base deficit: 1
O2 Saturation: 99
pO2, Arterial: 148 — ABNORMAL HIGH

## 2011-06-28 LAB — URINE MICROSCOPIC-ADD ON

## 2011-06-28 LAB — RENAL FUNCTION PANEL
Albumin: 2.8 — ABNORMAL LOW
BUN: 31 — ABNORMAL HIGH
CO2: 29
Chloride: 103
Creatinine, Ser: 1.58 — ABNORMAL HIGH
Glucose, Bld: 118 — ABNORMAL HIGH
Potassium: 3.4 — ABNORMAL LOW

## 2011-06-28 LAB — CARDIAC PANEL(CRET KIN+CKTOT+MB+TROPI)
CK, MB: 2.9
CK, MB: 3.2
Relative Index: 1.7
Total CK: 185
Troponin I: 0.09 — ABNORMAL HIGH
Troponin I: 0.12 — ABNORMAL HIGH
Troponin I: 0.14 — ABNORMAL HIGH

## 2011-06-28 LAB — B-NATRIURETIC PEPTIDE (CONVERTED LAB)
Pro B Natriuretic peptide (BNP): 1332 — ABNORMAL HIGH
Pro B Natriuretic peptide (BNP): 409 — ABNORMAL HIGH

## 2011-06-28 LAB — EXPECTORATED SPUTUM ASSESSMENT W GRAM STAIN, RFLX TO RESP C

## 2011-06-28 LAB — DIFFERENTIAL
Basophils Absolute: 0
Basophils Absolute: 0
Basophils Relative: 0
Basophils Relative: 0
Eosinophils Absolute: 0
Eosinophils Relative: 2
Lymphocytes Relative: 5 — ABNORMAL LOW
Lymphs Abs: 0.7
Monocytes Absolute: 0.9 — ABNORMAL HIGH
Monocytes Absolute: 1 — ABNORMAL HIGH
Monocytes Relative: 7
Neutro Abs: 11 — ABNORMAL HIGH
Neutro Abs: 11.3 — ABNORMAL HIGH
Neutro Abs: 9.4 — ABNORMAL HIGH
Neutrophils Relative %: 78 — ABNORMAL HIGH
Neutrophils Relative %: 97 — ABNORMAL HIGH

## 2011-06-28 LAB — CULTURE, BLOOD (ROUTINE X 2): Culture: NO GROWTH

## 2011-06-28 LAB — URINE CULTURE
Special Requests: NEGATIVE
Special Requests: NEGATIVE

## 2011-06-28 LAB — NA AND K (SODIUM & POTASSIUM), RAND UR: Sodium, Ur: 119

## 2011-06-28 LAB — D-DIMER, QUANTITATIVE: D-Dimer, Quant: 1.77 — ABNORMAL HIGH

## 2011-06-28 LAB — CULTURE, RESPIRATORY W GRAM STAIN: Culture: NORMAL

## 2011-06-28 LAB — CREATININE, URINE, RANDOM: Creatinine, Urine: 24.5

## 2011-06-28 LAB — MAGNESIUM: Magnesium: 2.2

## 2011-06-28 LAB — HEMOGLOBIN A1C: Hgb A1c MFr Bld: 6.7 — ABNORMAL HIGH

## 2011-06-28 LAB — AMYLASE: Amylase: 51

## 2011-06-28 LAB — TSH: TSH: 1.124

## 2011-07-21 ENCOUNTER — Encounter: Payer: Self-pay | Admitting: Internal Medicine

## 2011-07-21 DIAGNOSIS — I442 Atrioventricular block, complete: Secondary | ICD-10-CM

## 2011-08-16 ENCOUNTER — Ambulatory Visit: Payer: TRICARE For Life (TFL) | Admitting: Urology

## 2011-09-13 DIAGNOSIS — C768 Malignant neoplasm of other specified ill-defined sites: Secondary | ICD-10-CM

## 2011-09-13 HISTORY — DX: Malignant neoplasm of other specified ill-defined sites: C76.8

## 2011-10-20 ENCOUNTER — Encounter: Payer: Self-pay | Admitting: Internal Medicine

## 2011-10-20 DIAGNOSIS — I442 Atrioventricular block, complete: Secondary | ICD-10-CM

## 2012-01-19 ENCOUNTER — Encounter: Payer: Self-pay | Admitting: Internal Medicine

## 2012-01-19 DIAGNOSIS — I442 Atrioventricular block, complete: Secondary | ICD-10-CM

## 2012-02-02 ENCOUNTER — Ambulatory Visit: Payer: Self-pay | Admitting: Urology

## 2012-02-15 ENCOUNTER — Ambulatory Visit: Payer: Self-pay | Admitting: Oncology

## 2012-02-15 LAB — CBC CANCER CENTER
Eosinophil #: 0.2 x10 3/mm (ref 0.0–0.7)
HGB: 12.6 g/dL — ABNORMAL LOW (ref 13.0–18.0)
MCHC: 32.9 g/dL (ref 32.0–36.0)
MCV: 87 fL (ref 80–100)
Monocyte #: 0.6 x10 3/mm (ref 0.2–1.0)
Monocyte %: 8.6 %
Neutrophil #: 4.6 x10 3/mm (ref 1.4–6.5)
Platelet: 170 x10 3/mm (ref 150–440)
RBC: 4.41 10*6/uL (ref 4.40–5.90)
WBC: 6.6 x10 3/mm (ref 3.8–10.6)

## 2012-02-15 LAB — COMPREHENSIVE METABOLIC PANEL
Albumin: 4.2 g/dL (ref 3.4–5.0)
Alkaline Phosphatase: 68 U/L (ref 50–136)
Calcium, Total: 9.1 mg/dL (ref 8.5–10.1)
Chloride: 102 mmol/L (ref 98–107)
EGFR (African American): 36 — ABNORMAL LOW
EGFR (Non-African Amer.): 31 — ABNORMAL LOW
Glucose: 89 mg/dL (ref 65–99)
Osmolality: 286 (ref 275–301)
Potassium: 3.5 mmol/L (ref 3.5–5.1)
SGOT(AST): 19 U/L (ref 15–37)
Sodium: 142 mmol/L (ref 136–145)

## 2012-02-24 ENCOUNTER — Encounter: Payer: Self-pay | Admitting: *Deleted

## 2012-03-06 ENCOUNTER — Encounter: Payer: Self-pay | Admitting: Internal Medicine

## 2012-03-06 ENCOUNTER — Ambulatory Visit (INDEPENDENT_AMBULATORY_CARE_PROVIDER_SITE_OTHER): Payer: Medicare Other | Admitting: Internal Medicine

## 2012-03-06 VITALS — BP 120/60 | HR 72 | Ht 69.0 in | Wt 200.5 lb

## 2012-03-06 DIAGNOSIS — Z950 Presence of cardiac pacemaker: Secondary | ICD-10-CM

## 2012-03-06 DIAGNOSIS — I442 Atrioventricular block, complete: Secondary | ICD-10-CM

## 2012-03-06 LAB — PACEMAKER DEVICE OBSERVATION
AL AMPLITUDE: 3 mv
BAMS-0001: 150 {beats}/min
DEVICE MODEL PM: 1945643
RV LEAD IMPEDENCE PM: 497 Ohm
RV LEAD THRESHOLD: 0.625 V

## 2012-03-06 NOTE — Progress Notes (Signed)
  HPI  Derek [name] is a 78 y.o. male Seen in followup for a pacemaker implantation for complete heart block and 2008. I catheterization at that time he had normal coronary arteries normal systolic function.  His biggest complaint is aches and weakness of his legs.  He does not recall ever having had a stent. There is a record of an angioplasty in 2003  The patient denies SOB, chest pain edema or palpitations.  There has been no syncope or presyncope.  He has recurrent and metastatic prostate cancer with bone pain and is now on hormonal/hchemo Rx .    Past Medical History  Diagnosis Date  . Pacemaker   . Hypertension   . DM (diabetes mellitus)   . Coronary artery disease     Angioplasty 2003 details not available on Plavix  . Arthritis   . Gout   . Cancer of prostate   . Complete heart block   . Muscle ache     On Mevacor    Past Surgical History  Procedure Date  . Total hip arthroplasty   . Prostate surgery   . Pacemaker insertion   . Coronary angioplasty     Current Outpatient Prescriptions  Medication Sig Dispense Refill  . aspirin 81 MG EC tablet Take 81 mg by mouth daily.        Marland Kitchen CALCIUM PO Take by mouth.        . Cholecalciferol (VITAMIN D3) 1000 UNITS CAPS Take 4 tablets by mouth daily.        . clopidogrel (PLAVIX) 75 MG tablet Take 75 mg by mouth daily.        . furosemide (LASIX) 40 MG tablet TAKE 1 TABLET DAILY...TAKE ADDITIONAL TABLET ONCE FOR INCREASED SWELLING 1-2 TIMES A WEEK  40 tablet  11  . insulin glargine (LANTUS) 100 UNIT/ML injection Inject 10 Units into the skin at bedtime.        . irbesartan (AVAPRO) 150 MG tablet Take 150 mg by mouth daily.        . Multiple Vitamin (MULTIVITAMIN) capsule Take 1 capsule by mouth daily.        . repaglinide (PRANDIN) 1 MG tablet Take 1 mg by mouth once.        . vitamin E 400 UNIT capsule Take 800 Units by mouth daily.          Allergies  Allergen Reactions  . Penicillins     Review of Systems  negative except from HPI and PMH  Physical Exam BP 120/60  Pulse 72  Ht 5\' 9"  (1.753 m)  Wt 200 lb 8 oz (90.946 kg)  BMI 29.61 kg/m2 Well developed and well nourished in no acute distress HENT normal E scleral and icterus clear; left eye blindness Neck Supple JVP flat; carotids parvus et tardusClear to ausculation *Regular rate and rhythm, single S2 and low pitched cresendo decrescendo murmur Soft with active bowel sounds No clubbing cyanosis Trace Edema Alert and oriented, grossly normal motor and sensory function Skin Warm and Dry    Assessment and  Plan

## 2012-03-06 NOTE — Assessment & Plan Note (Signed)
stable °

## 2012-03-06 NOTE — Patient Instructions (Addendum)
Your physician wants you to follow-up in: 12 months with Dr. Klein. You will receive a reminder letter in the mail two months in advance. If you don't receive a letter, please call our office to schedule the follow-up appointment.  

## 2012-03-06 NOTE — Assessment & Plan Note (Signed)
The patient's device was interrogated.  The information was reviewed. No changes were made in the programming.    

## 2012-03-12 ENCOUNTER — Ambulatory Visit: Payer: Self-pay | Admitting: Oncology

## 2012-03-22 LAB — BASIC METABOLIC PANEL
Anion Gap: 9 (ref 7–16)
Calcium, Total: 9 mg/dL (ref 8.5–10.1)
EGFR (African American): 41 — ABNORMAL LOW
EGFR (Non-African Amer.): 36 — ABNORMAL LOW
Glucose: 132 mg/dL — ABNORMAL HIGH (ref 65–99)

## 2012-04-12 ENCOUNTER — Ambulatory Visit: Payer: Self-pay | Admitting: Oncology

## 2012-04-19 DIAGNOSIS — I442 Atrioventricular block, complete: Secondary | ICD-10-CM

## 2012-06-21 ENCOUNTER — Ambulatory Visit: Payer: Self-pay | Admitting: Oncology

## 2012-06-21 LAB — CBC CANCER CENTER
Basophil #: 0.1 x10 3/mm (ref 0.0–0.1)
Eosinophil #: 0.2 x10 3/mm (ref 0.0–0.7)
Lymphocyte %: 21.9 %
MCHC: 32.9 g/dL (ref 32.0–36.0)
Neutrophil %: 64.4 %
Platelet: 186 x10 3/mm (ref 150–440)
RDW: 16.2 % — ABNORMAL HIGH (ref 11.5–14.5)

## 2012-06-21 LAB — COMPREHENSIVE METABOLIC PANEL
Albumin: 4.2 g/dL (ref 3.4–5.0)
Anion Gap: 13 (ref 7–16)
Calcium, Total: 8.7 mg/dL (ref 8.5–10.1)
Chloride: 103 mmol/L (ref 98–107)
Co2: 23 mmol/L (ref 21–32)
EGFR (Non-African Amer.): 33 — ABNORMAL LOW
Osmolality: 281 (ref 275–301)
Potassium: 3.7 mmol/L (ref 3.5–5.1)
Sodium: 139 mmol/L (ref 136–145)

## 2012-07-06 ENCOUNTER — Encounter: Payer: Self-pay | Admitting: Cardiovascular Disease

## 2012-07-06 ENCOUNTER — Ambulatory Visit (INDEPENDENT_AMBULATORY_CARE_PROVIDER_SITE_OTHER): Payer: Medicare Other | Admitting: Cardiovascular Disease

## 2012-07-06 VITALS — BP 140/62 | HR 60 | Ht 70.0 in | Wt 204.0 lb

## 2012-07-06 DIAGNOSIS — I442 Atrioventricular block, complete: Secondary | ICD-10-CM

## 2012-07-06 DIAGNOSIS — I350 Nonrheumatic aortic (valve) stenosis: Secondary | ICD-10-CM

## 2012-07-06 DIAGNOSIS — I2510 Atherosclerotic heart disease of native coronary artery without angina pectoris: Secondary | ICD-10-CM

## 2012-07-06 DIAGNOSIS — I359 Nonrheumatic aortic valve disorder, unspecified: Secondary | ICD-10-CM

## 2012-07-06 DIAGNOSIS — R609 Edema, unspecified: Secondary | ICD-10-CM

## 2012-07-06 DIAGNOSIS — E119 Type 2 diabetes mellitus without complications: Secondary | ICD-10-CM

## 2012-07-06 MED ORDER — FUROSEMIDE 40 MG PO TABS: ORAL_TABLET | ORAL | Status: DC

## 2012-07-06 NOTE — Progress Notes (Signed)
Patient ID: Derek [name], male    DOB: November 08, 1916, 78 y.o.   MRN: [id number]  HPI Comments: Derek [name] is a very pleasant 78 year old gentleman with a history of complete heart block, pacemaker placed in March 23, 2007, history of lower extremity weakness with no significant coronary artery disease by catheterization in July 2008, normal systolic function, moderate aortic valve stenosis, hypertension, lower extremity edema and hyperlipidemia who presents for routine follow up. He does have a history of dizziness back in August of 2011 where he could not get out of bed. Symptoms resolved without further intervention. No further episodes since that time.   he's not very active, does walk with 2 canes. His edema has improved. He is also been trying to watch his weight. He denies any significant shortness of breath, chest tightness or near syncope episodes.  He reports having diagnosis of prostate cancer, metastatic. He is on Lupron since June of this year. This was found on a bone scan. He reports he is on hospice. He does require a pain pill from pain in his back and hips.   He does report having an echocardiogram in April 2012 performed at [facility].This was read by Dr. Patrecia Pace. Measurements of his aortic valve indicate a valve area of 1.3 cm. Mean gradient 17 mm mercury, peak gradient 28 mm of mercury. This would suggest mild aortic valve stenosis by mean gradient, moderate by valve area.   EKG shows  rate is 60 beats per minute with left bundle Branch block          Outpatient Encounter Prescriptions as of 07/06/2012  Medication Sig Dispense Refill  . amitriptyline (ELAVIL) 10 MG tablet Take 10 mg by mouth at bedtime.      Marland Kitchen aspirin 81 MG EC tablet Take 81 mg by mouth daily.        Marland Kitchen CALCIUM PO Take by mouth.        . clopidogrel (PLAVIX) 75 MG tablet Take 75 mg by mouth daily.        . furosemide (LASIX) 40 MG tablet Take 1 tablet daily.Marland KitchenMarland KitchenTake additional tablet once for increased swelling 1-2  times a week.  120 tablet  3  . HYDROcodone-acetaminophen (NORCO) 5-325 MG per tablet 1 -2 tablets as needed for pain.      Marland Kitchen ibuprofen (ADVIL,MOTRIN) 400 MG tablet Take 400 mg by mouth 2 (two) times daily.      . insulin glargine (LANTUS) 100 UNIT/ML injection Inject 25 Units into the skin daily.       . irbesartan (AVAPRO) 150 MG tablet Take 150 mg by mouth daily.        Marland Kitchen linagliptin (TRADJENTA) 5 MG TABS tablet Take 5 mg by mouth daily.      . Multiple Vitamin (MULTIVITAMIN) capsule Take 1 capsule by mouth daily.        . repaglinide (PRANDIN) 1 MG tablet Take 1 mg by mouth 3 (three) times daily before meals.       . vitamin E 400 UNIT capsule Take 800 Units by mouth daily.          Review of Systems  Constitutional: Negative.   HENT: Negative.   Eyes: Negative.   Respiratory: Negative.   Cardiovascular: Negative.   Gastrointestinal: Negative.   Musculoskeletal: Positive for back pain, arthralgias and gait problem.  Skin: Negative.   Neurological: Negative.   Hematological: Negative.   Psychiatric/Behavioral: Negative.   All other systems reviewed and are negative.  BP 140/62  Pulse 60  Ht 5\' 10"  (1.778 m)  Wt 204 lb (92.534 kg)  BMI 29.27 kg/m2  Physical Exam  Nursing note and vitals reviewed. Constitutional: He is oriented to person, place, and time. He appears well-developed and well-nourished.  HENT:  Head: Normocephalic.  Nose: Nose normal.  Mouth/Throat: Oropharynx is clear and moist.  Eyes: Conjunctivae normal are normal. Pupils are equal, round, and reactive to light.  Neck: Normal range of motion. Neck supple. No JVD present.  Cardiovascular: Normal rate, regular rhythm, S1 normal, S2 normal and intact distal pulses.  Exam reveals no gallop and no friction rub.   Murmur heard.  Crescendo systolic murmur is present with a grade of 3/6  Pulmonary/Chest: Effort normal and breath sounds normal. No respiratory distress. He has no wheezes. He has no rales. He  exhibits no tenderness.  Abdominal: Soft. Bowel sounds are normal. He exhibits no distension. There is no tenderness.  Musculoskeletal: Normal range of motion. He exhibits no edema and no tenderness.  Lymphadenopathy:    He has no cervical adenopathy.  Neurological: He is alert and oriented to person, place, and time. Coordination normal.  Skin: Skin is warm and dry. No rash noted. No erythema.  Psychiatric: He has a normal mood and affect. His behavior is normal. Judgment and thought content normal.           Assessment and Plan        .

## 2012-07-06 NOTE — Assessment & Plan Note (Signed)
We have encouraged  careful diet management in an effort to lose weight. I

## 2012-07-06 NOTE — Assessment & Plan Note (Signed)
Pacemaker in place. Followed by EP

## 2012-07-06 NOTE — Patient Instructions (Addendum)
You are doing well. No medication changes were made.  Please call us if you have new issues that need to be addressed before your next appt.  Your physician wants you to follow-up in: 6 months.  You will receive a reminder letter in the mail two months in advance. If you don't receive a letter, please call our office to schedule the follow-up appointment.   

## 2012-07-06 NOTE — Assessment & Plan Note (Addendum)
Underlying aortic valve stenosis likely contributing to mild fluid retention. Likely not limiting his ability to exert himself as he does not walk very far.

## 2012-07-06 NOTE — Assessment & Plan Note (Signed)
Minimal edema on today's visit. No changes to his medications

## 2012-07-06 NOTE — Assessment & Plan Note (Signed)
Currently with no symptoms of angina. No further workup at this time. Continue current medication regimen. 

## 2012-07-13 ENCOUNTER — Ambulatory Visit: Payer: Self-pay | Admitting: Oncology

## 2012-07-19 DIAGNOSIS — I442 Atrioventricular block, complete: Secondary | ICD-10-CM

## 2012-08-23 ENCOUNTER — Ambulatory Visit: Payer: Self-pay | Admitting: Oncology

## 2012-08-23 LAB — COMPREHENSIVE METABOLIC PANEL
Alkaline Phosphatase: 48 U/L — ABNORMAL LOW (ref 50–136)
Bilirubin,Total: 0.7 mg/dL (ref 0.2–1.0)
Calcium, Total: 9 mg/dL (ref 8.5–10.1)
Chloride: 103 mmol/L (ref 98–107)
Co2: 28 mmol/L (ref 21–32)
Creatinine: 1.7 mg/dL — ABNORMAL HIGH (ref 0.60–1.30)
EGFR (African American): 39 — ABNORMAL LOW
EGFR (Non-African Amer.): 34 — ABNORMAL LOW
Glucose: 122 mg/dL — ABNORMAL HIGH (ref 65–99)
Osmolality: 284 (ref 275–301)
SGOT(AST): 24 U/L (ref 15–37)
SGPT (ALT): 40 U/L (ref 12–78)
Sodium: 139 mmol/L (ref 136–145)

## 2012-08-23 LAB — CBC CANCER CENTER
Basophil #: 0.1 x10 3/mm (ref 0.0–0.1)
Eosinophil #: 0.3 x10 3/mm (ref 0.0–0.7)
Eosinophil %: 3.7 %
HCT: 35.4 % — ABNORMAL LOW (ref 40.0–52.0)
Lymphocyte #: 1.4 x10 3/mm (ref 1.0–3.6)
MCV: 85 fL (ref 80–100)
Monocyte #: 0.7 x10 3/mm (ref 0.2–1.0)
Monocyte %: 9.5 %
Neutrophil #: 4.9 x10 3/mm (ref 1.4–6.5)
RBC: 4.17 10*6/uL — ABNORMAL LOW (ref 4.40–5.90)
WBC: 7.4 x10 3/mm (ref 3.8–10.6)

## 2012-09-12 ENCOUNTER — Ambulatory Visit: Payer: Self-pay | Admitting: Oncology

## 2012-10-18 ENCOUNTER — Encounter: Payer: Self-pay | Admitting: Internal Medicine

## 2012-10-18 DIAGNOSIS — I442 Atrioventricular block, complete: Secondary | ICD-10-CM

## 2013-01-07 ENCOUNTER — Ambulatory Visit: Payer: Medicare Other | Admitting: Cardiovascular Disease

## 2013-01-17 ENCOUNTER — Ambulatory Visit: Payer: Self-pay | Admitting: Endocrinology

## 2013-01-17 DIAGNOSIS — I442 Atrioventricular block, complete: Secondary | ICD-10-CM

## 2013-02-08 ENCOUNTER — Ambulatory Visit: Payer: Medicare Other | Admitting: Cardiovascular Disease

## 2013-02-11 ENCOUNTER — Ambulatory Visit: Payer: Medicare Other | Admitting: Cardiovascular Disease

## 2013-03-08 ENCOUNTER — Observation Stay: Payer: Self-pay | Admitting: Internal Medicine

## 2013-03-08 LAB — URINALYSIS, COMPLETE
Bilirubin,UR: NEGATIVE
Blood: NEGATIVE
Glucose,UR: NEGATIVE mg/dL (ref 0–75)
Ph: 6 (ref 4.5–8.0)
RBC,UR: <1 /HPF (ref 0–5)
Specific Gravity: 1.011 (ref 1.003–1.030)
Squamous Epithelial: <1
WBC UR: <1 /HPF (ref 0–5)

## 2013-03-08 LAB — CK-MB: CK-MB: 3.1 ng/mL (ref 0.5–3.6)

## 2013-03-08 LAB — CBC WITH DIFFERENTIAL/PLATELET
Basophil #: 0.1 10*3/uL (ref 0.0–0.1)
Eosinophil %: 2.3 %
HGB: 12.1 g/dL — ABNORMAL LOW (ref 13.0–18.0)
Lymphocyte #: 1 10*3/uL (ref 1.0–3.6)
Lymphocyte %: 7.6 %
MCH: 29.2 pg (ref 26.0–34.0)
MCHC: 34.2 g/dL (ref 32.0–36.0)
MCV: 85 fL (ref 80–100)
Monocyte #: 1 x10 3/mm (ref 0.2–1.0)
Neutrophil %: 82 %

## 2013-03-08 LAB — COMPREHENSIVE METABOLIC PANEL
Albumin: 3.9 g/dL (ref 3.4–5.0)
Alkaline Phosphatase: 63 U/L (ref 50–136)
BUN: 30 mg/dL — ABNORMAL HIGH (ref 7–18)
Bilirubin,Total: 0.6 mg/dL (ref 0.2–1.0)
Calcium, Total: 9.1 mg/dL (ref 8.5–10.1)
Co2: 28 mmol/L (ref 21–32)
EGFR (Non-African Amer.): 35 — ABNORMAL LOW
Potassium: 3.8 mmol/L (ref 3.5–5.1)
SGPT (ALT): 22 U/L (ref 12–78)
Sodium: 140 mmol/L (ref 136–145)
Total Protein: 7.3 g/dL (ref 6.4–8.2)

## 2013-03-08 LAB — TROPONIN I
Troponin-I: <0.02 ng/mL
Troponin-I: <0.02 ng/mL

## 2013-03-08 LAB — PROTIME-INR: Prothrombin Time: 13.9 secs (ref 11.5–14.7)

## 2013-03-08 LAB — CK TOTAL AND CKMB (NOT AT ARMC): CK, Total: 139 U/L (ref 35–232)

## 2013-03-09 LAB — CBC WITH DIFFERENTIAL/PLATELET
Basophil #: 0.1 10*3/uL (ref 0.0–0.1)
Basophil %: 0.8 %
Eosinophil #: 0.2 10*3/uL (ref 0.0–0.7)
HCT: 33.9 % — ABNORMAL LOW (ref 40.0–52.0)
Lymphocyte #: 0.9 10*3/uL — ABNORMAL LOW (ref 1.0–3.6)
Lymphocyte %: 8.3 %
MCV: 85 fL (ref 80–100)
Monocyte #: 0.9 x10 3/mm (ref 0.2–1.0)
Monocyte %: 8.8 %
RBC: 4 10*6/uL — ABNORMAL LOW (ref 4.40–5.90)
RDW: 16.5 % — ABNORMAL HIGH (ref 11.5–14.5)
WBC: 10.3 10*3/uL (ref 3.8–10.6)

## 2013-03-09 LAB — BASIC METABOLIC PANEL
Anion Gap: 8 (ref 7–16)
Co2: 25 mmol/L (ref 21–32)
Creatinine: 1.48 mg/dL — ABNORMAL HIGH (ref 0.60–1.30)
EGFR (African American): 46 — ABNORMAL LOW
Sodium: 138 mmol/L (ref 136–145)

## 2013-03-10 LAB — BASIC METABOLIC PANEL
Anion Gap: 10 (ref 7–16)
BUN: 46 mg/dL — ABNORMAL HIGH (ref 7–18)
Chloride: 107 mmol/L (ref 98–107)
Co2: 22 mmol/L (ref 21–32)
EGFR (Non-African Amer.): 33 — ABNORMAL LOW
Osmolality: 289 (ref 275–301)
Potassium: 4.4 mmol/L (ref 3.5–5.1)
Sodium: 139 mmol/L (ref 136–145)

## 2013-03-10 LAB — CBC WITH DIFFERENTIAL/PLATELET
Basophil #: 0.1 10*3/uL (ref 0.0–0.1)
Basophil %: 1 %
HCT: 36.3 % — ABNORMAL LOW (ref 40.0–52.0)
Lymphocyte #: 1.2 10*3/uL (ref 1.0–3.6)
Lymphocyte %: 9.7 %
MCH: 28.8 pg (ref 26.0–34.0)
MCHC: 33.3 g/dL (ref 32.0–36.0)
MCV: 86 fL (ref 80–100)
Monocyte #: 1.1 x10 3/mm — ABNORMAL HIGH (ref 0.2–1.0)
Monocyte %: 8.4 %
Neutrophil %: 78.3 %
Platelet: 177 10*3/uL (ref 150–440)
RBC: 4.21 10*6/uL — ABNORMAL LOW (ref 4.40–5.90)
WBC: 12.6 10*3/uL — ABNORMAL HIGH (ref 3.8–10.6)

## 2013-03-10 LAB — URINALYSIS, COMPLETE
Blood: NEGATIVE
Glucose,UR: NEGATIVE mg/dL (ref 0–75)
Hyaline Cast: 8
Ketone: NEGATIVE
Leukocyte Esterase: NEGATIVE
Nitrite: NEGATIVE
RBC,UR: 2 /HPF (ref 0–5)
Specific Gravity: 1.018 (ref 1.003–1.030)
Squamous Epithelial: <1
WBC UR: 1 /HPF (ref 0–5)

## 2013-03-11 ENCOUNTER — Inpatient Hospital Stay: Payer: Self-pay | Admitting: Internal Medicine

## 2013-03-11 ENCOUNTER — Encounter: Payer: Medicare Other | Admitting: Internal Medicine

## 2013-03-11 LAB — BASIC METABOLIC PANEL
Anion Gap: 5 — ABNORMAL LOW (ref 7–16)
BUN: 35 mg/dL — ABNORMAL HIGH (ref 7–18)
Calcium, Total: 9.2 mg/dL (ref 8.5–10.1)
Chloride: 109 mmol/L — ABNORMAL HIGH (ref 98–107)
EGFR (African American): 49 — ABNORMAL LOW
EGFR (Non-African Amer.): 42 — ABNORMAL LOW
Glucose: 78 mg/dL (ref 65–99)
Osmolality: 284 (ref 275–301)

## 2013-03-11 LAB — CBC WITH DIFFERENTIAL/PLATELET
Basophil #: 0.1 10*3/uL (ref 0.0–0.1)
Eosinophil #: 0.3 10*3/uL (ref 0.0–0.7)
HGB: 11.2 g/dL — ABNORMAL LOW (ref 13.0–18.0)
MCH: 29.3 pg (ref 26.0–34.0)
MCV: 86 fL (ref 80–100)
Monocyte #: 0.8 x10 3/mm (ref 0.2–1.0)
Monocyte %: 9.1 %
Neutrophil #: 7.1 10*3/uL — ABNORMAL HIGH (ref 1.4–6.5)
Platelet: 160 10*3/uL (ref 150–440)
RBC: 3.82 10*6/uL — ABNORMAL LOW (ref 4.40–5.90)

## 2013-03-13 ENCOUNTER — Ambulatory Visit: Payer: Medicare Other | Admitting: Cardiovascular Disease

## 2013-03-22 ENCOUNTER — Encounter: Payer: Self-pay | Admitting: *Deleted

## 2013-03-25 ENCOUNTER — Encounter: Payer: Self-pay | Admitting: Internal Medicine

## 2013-03-25 ENCOUNTER — Encounter: Payer: Medicare Other | Admitting: Internal Medicine

## 2013-03-25 ENCOUNTER — Ambulatory Visit (INDEPENDENT_AMBULATORY_CARE_PROVIDER_SITE_OTHER): Payer: Medicare Other | Admitting: *Deleted

## 2013-03-25 VITALS — BP 120/60 | HR 65 | Ht 69.5 in | Wt 183.2 lb

## 2013-03-25 DIAGNOSIS — I442 Atrioventricular block, complete: Secondary | ICD-10-CM

## 2013-03-25 DIAGNOSIS — I2510 Atherosclerotic heart disease of native coronary artery without angina pectoris: Secondary | ICD-10-CM

## 2013-03-25 LAB — PACEMAKER DEVICE OBSERVATION
ATRIAL PACING PM: 66
BAMS-0001: 150 {beats}/min
BAMS-0003: 70 {beats}/min
DEVICE MODEL PM: 1945643
RV LEAD THRESHOLD: 0.625 V
VENTRICULAR PACING PM: 95

## 2013-03-25 NOTE — Patient Instructions (Addendum)
Follow up with Dr. Graciela Husbands in one year. Continue with current medications.

## 2013-03-29 ENCOUNTER — Ambulatory Visit: Payer: Medicare Other | Admitting: Cardiovascular Disease

## 2013-03-29 ENCOUNTER — Ambulatory Visit (INDEPENDENT_AMBULATORY_CARE_PROVIDER_SITE_OTHER): Payer: Medicare Other | Admitting: Cardiovascular Disease

## 2013-03-29 ENCOUNTER — Encounter: Payer: Self-pay | Admitting: Cardiovascular Disease

## 2013-03-29 VITALS — BP 121/69 | HR 66 | Ht 70.0 in | Wt 185.5 lb

## 2013-03-29 DIAGNOSIS — I442 Atrioventricular block, complete: Secondary | ICD-10-CM

## 2013-03-29 DIAGNOSIS — I2510 Atherosclerotic heart disease of native coronary artery without angina pectoris: Secondary | ICD-10-CM

## 2013-03-29 DIAGNOSIS — R079 Chest pain, unspecified: Secondary | ICD-10-CM

## 2013-03-29 DIAGNOSIS — I10 Essential (primary) hypertension: Secondary | ICD-10-CM

## 2013-03-29 DIAGNOSIS — E785 Hyperlipidemia, unspecified: Secondary | ICD-10-CM

## 2013-03-29 DIAGNOSIS — E119 Type 2 diabetes mellitus without complications: Secondary | ICD-10-CM

## 2013-03-29 NOTE — Assessment & Plan Note (Signed)
Cholesterol is at goal on the current lipid regimen. No changes to the medications were made.  

## 2013-03-29 NOTE — Assessment & Plan Note (Signed)
Pacemaker in place. Followed by Corinda Gubler EP.

## 2013-03-29 NOTE — Assessment & Plan Note (Signed)
Currently with no symptoms of angina. No further workup at this time. Continue current medication regimen. 

## 2013-03-29 NOTE — Assessment & Plan Note (Signed)
Blood pressure is well controlled on today's visit. No changes made to the medications. 

## 2013-03-29 NOTE — Assessment & Plan Note (Signed)
Followed by Dr. Patrecia Pace. Hemoglobin A1c 7.1

## 2013-03-29 NOTE — Patient Instructions (Addendum)
You are doing well. No medication changes were made.  Please call us if you have new issues that need to be addressed before your next appt.  Your physician wants you to follow-up in: 12 months.  You will receive a reminder letter in the mail two months in advance. If you don't receive a letter, please call our office to schedule the follow-up appointment. 

## 2013-03-29 NOTE — Progress Notes (Signed)
Patient ID: Derek [name], male    DOB: November 30, 1916, 79 y.o.   MRN: [id number]  HPI Comments: Derek [name] is a very pleasant 79 year old gentleman with a history of complete heart block, pacemaker placed in March 23, 2007, diabetes, history of lower extremity weakness with no significant coronary artery disease by catheterization in July 2008, normal systolic function, mild aortic valve stenosis by echocardiogram in 2012 and 2014, hypertension, lower extremity edema and hyperlipidemia who presents for routine follow up.  history of dizziness back in August of 2011 where he could not get out of bed. Symptoms resolved without further intervention. No further episodes since that time.   he's not very active, walks with a walker. Presenting today in a wheelchair. No new complaints. No significant edema. He denies any significant shortness of breath, chest tightness or near syncope episodes.   Previous diagnosis of prostate cancer, metastatic. Has been on Lupron. This was found on a bone scan.  He does require a pain pill from pain in his back and hips.  In 2012, Measurements of his aortic valve indicate a valve area of 1.3 cm. Mean gradient 17 mm mercury, peak gradient 28 mm of mercury.  Echocardiogram may 2014 with ejection fraction 45-50%, mild to moderate MR Lab work from April 2014 showing hemoglobin A1c 7.1 , creatinine 1.643 BUN 15, total cholesterol 135, LDL 58, HDL 38   EKG shows  rate is 66 beats per minute with left bundle Branch block          Outpatient Encounter Prescriptions as of 03/29/2013  Medication Sig Dispense Refill  . acetaminophen (TYLENOL) 325 MG tablet Take 650 mg by mouth every 6 (six) hours as needed for pain.      Marland Kitchen aspirin 81 MG EC tablet Take 81 mg by mouth daily.        . bisacodyl (DULCOLAX) 5 MG EC tablet Take 10 mg by mouth daily.      . clopidogrel (PLAVIX) 75 MG tablet Take 75 mg by mouth daily.        Marland Kitchen docusate sodium (COLACE) 100 MG capsule Take 200 mg by  mouth daily.      . dorzolamide (TRUSOPT) 2 % ophthalmic solution Place 1 drop into both eyes 2 (two) times daily.      . insulin glargine (LANTUS) 100 UNIT/ML injection Inject 20 Units into the skin daily.       . irbesartan (AVAPRO) 75 MG tablet Take 75 mg by mouth at bedtime.      Marland Kitchen QUEtiapine (SEROQUEL) 25 MG tablet Take 25 mg by mouth as needed.      . repaglinide (PRANDIN) 1 MG tablet Take 1 mg by mouth 3 (three) times daily before meals.       . traMADol (ULTRAM) 50 MG tablet Take 50 mg by mouth every 6 (six) hours as needed for pain.      . Vitamin E 200 UNITS TABS Take by mouth daily.         Review of Systems  Constitutional: Negative.   HENT: Negative.   Eyes: Negative.   Respiratory: Negative.   Cardiovascular: Negative.   Gastrointestinal: Negative.   Musculoskeletal: Positive for back pain, arthralgias and gait problem.  Skin: Negative.   Neurological: Negative.   Psychiatric/Behavioral: Negative.   All other systems reviewed and are negative.    BP 121/69  Pulse 66  Ht 5\' 10"  (1.778 m)  Wt 185 lb 8 oz (84.142 kg)  BMI 26.62 kg/m2  Physical Exam  Nursing note and vitals reviewed. Constitutional: He is oriented to person, place, and time. He appears well-developed and well-nourished.  HENT:  Head: Normocephalic.  Nose: Nose normal.  Mouth/Throat: Oropharynx is clear and moist.  Eyes: Conjunctivae are normal. Pupils are equal, round, and reactive to light.  Neck: Normal range of motion. Neck supple. No JVD present.  Cardiovascular: Normal rate, regular rhythm, S1 normal, S2 normal and intact distal pulses.  Exam reveals no gallop and no friction rub.   Murmur heard.  Crescendo systolic murmur is present with a grade of 2/6  Pulmonary/Chest: Effort normal and breath sounds normal. No respiratory distress. He has no wheezes. He has no rales. He exhibits no tenderness.  Abdominal: Soft. Bowel sounds are normal. He exhibits no distension. There is no tenderness.   Musculoskeletal: Normal range of motion. He exhibits no edema and no tenderness.  Lymphadenopathy:    He has no cervical adenopathy.  Neurological: He is alert and oriented to person, place, and time. Coordination normal.  Skin: Skin is warm and dry. No rash noted. No erythema.  Psychiatric: He has a normal mood and affect. His behavior is normal. Judgment and thought content normal.      Assessment and Plan        .

## 2013-04-02 ENCOUNTER — Encounter: Payer: Self-pay | Admitting: *Deleted

## 2013-04-04 ENCOUNTER — Emergency Department: Payer: Self-pay | Admitting: Emergency Medicine

## 2013-04-04 LAB — COMPREHENSIVE METABOLIC PANEL
Albumin: 3.6 g/dL (ref 3.4–5.0)
Alkaline Phosphatase: 71 U/L (ref 50–136)
Anion Gap: 7 (ref 7–16)
Bilirubin,Total: 0.8 mg/dL (ref 0.2–1.0)
Calcium, Total: 9.4 mg/dL (ref 8.5–10.1)
Co2: 24 mmol/L (ref 21–32)
EGFR (African American): 44 — ABNORMAL LOW
EGFR (Non-African Amer.): 38 — ABNORMAL LOW
Potassium: 4 mmol/L (ref 3.5–5.1)
SGOT(AST): 19 U/L (ref 15–37)
Sodium: 138 mmol/L (ref 136–145)

## 2013-04-04 LAB — URINALYSIS, COMPLETE
Bacteria: NONE SEEN
Bilirubin,UR: NEGATIVE
Blood: NEGATIVE
Glucose,UR: NEGATIVE mg/dL (ref 0–75)
Leukocyte Esterase: NEGATIVE
Ph: 5 (ref 4.5–8.0)
Protein: NEGATIVE
RBC,UR: 1 /HPF (ref 0–5)
Specific Gravity: 1.014 (ref 1.003–1.030)
WBC UR: <1 /HPF (ref 0–5)

## 2013-04-04 LAB — CBC
HCT: 34.8 % — ABNORMAL LOW (ref 40.0–52.0)
MCHC: 34.2 g/dL (ref 32.0–36.0)
RDW: 16.9 % — ABNORMAL HIGH (ref 11.5–14.5)
WBC: 8.5 10*3/uL (ref 3.8–10.6)

## 2013-04-16 NOTE — Progress Notes (Signed)
Changed to Pacer check with Gunnar Fusi

## 2013-04-18 DIAGNOSIS — I442 Atrioventricular block, complete: Secondary | ICD-10-CM

## 2013-04-28 ENCOUNTER — Other Ambulatory Visit: Payer: Self-pay

## 2013-04-28 LAB — CBC WITH DIFFERENTIAL/PLATELET
Basophil #: 0.1 10*3/uL (ref 0.0–0.1)
Basophil %: 0.9 %
HCT: 35.4 % — ABNORMAL LOW (ref 40.0–52.0)
Lymphocyte #: 0.9 10*3/uL — ABNORMAL LOW (ref 1.0–3.6)
Lymphocyte %: 10.2 %
MCH: 29.5 pg (ref 26.0–34.0)
MCHC: 34.3 g/dL (ref 32.0–36.0)
MCV: 86 fL (ref 80–100)
Monocyte %: 5.7 %
Neutrophil #: 7.2 10*3/uL — ABNORMAL HIGH (ref 1.4–6.5)
Neutrophil %: 80.8 %
Platelet: 190 10*3/uL (ref 150–440)
WBC: 9 10*3/uL (ref 3.8–10.6)

## 2013-04-28 LAB — COMPREHENSIVE METABOLIC PANEL
Albumin: 3.9 g/dL (ref 3.4–5.0)
BUN: 29 mg/dL — ABNORMAL HIGH (ref 7–18)
Calcium, Total: 9 mg/dL (ref 8.5–10.1)
Chloride: 108 mmol/L — ABNORMAL HIGH (ref 98–107)
EGFR (Non-African Amer.): 37 — ABNORMAL LOW
Glucose: 157 mg/dL — ABNORMAL HIGH (ref 65–99)
Potassium: 4.2 mmol/L (ref 3.5–5.1)
SGOT(AST): 19 U/L (ref 15–37)
SGPT (ALT): 18 U/L (ref 12–78)
Sodium: 140 mmol/L (ref 136–145)

## 2013-07-18 ENCOUNTER — Encounter: Payer: Self-pay | Admitting: Internal Medicine

## 2013-07-18 DIAGNOSIS — I442 Atrioventricular block, complete: Secondary | ICD-10-CM

## 2013-10-17 ENCOUNTER — Encounter: Payer: Self-pay | Admitting: Internal Medicine

## 2013-10-17 DIAGNOSIS — I495 Sick sinus syndrome: Secondary | ICD-10-CM

## 2013-10-19 ENCOUNTER — Other Ambulatory Visit: Payer: Self-pay | Admitting: Family Medicine

## 2013-10-19 LAB — VANCOMYCIN, TROUGH: Vancomycin, Trough: 9 ug/mL — ABNORMAL LOW (ref 10–20)

## 2013-10-27 ENCOUNTER — Other Ambulatory Visit: Payer: Self-pay

## 2013-10-27 LAB — PROTIME-INR
INR: 1.2
Prothrombin Time: 14.7 secs (ref 11.5–14.7)

## 2013-11-03 ENCOUNTER — Other Ambulatory Visit: Payer: Self-pay

## 2013-11-03 LAB — PROTIME-INR
INR: 1.7
PROTHROMBIN TIME: 19.9 s — AB (ref 11.5–14.7)

## 2013-11-04 ENCOUNTER — Inpatient Hospital Stay: Payer: Self-pay | Admitting: Internal Medicine

## 2013-11-04 LAB — BASIC METABOLIC PANEL
Anion Gap: 5 — ABNORMAL LOW (ref 7–16)
BUN: 73 mg/dL — ABNORMAL HIGH (ref 7–18)
CO2: 28 mmol/L (ref 21–32)
CREATININE: 1.84 mg/dL — AB (ref 0.60–1.30)
Calcium, Total: 8 mg/dL — ABNORMAL LOW (ref 8.5–10.1)
Chloride: 101 mmol/L (ref 98–107)
EGFR (African American): 36 — ABNORMAL LOW
EGFR (Non-African Amer.): 31 — ABNORMAL LOW
Glucose: 50 mg/dL — ABNORMAL LOW (ref 65–99)
Osmolality: 287 (ref 275–301)
POTASSIUM: 4.7 mmol/L (ref 3.5–5.1)
Sodium: 134 mmol/L — ABNORMAL LOW (ref 136–145)

## 2013-11-04 LAB — CBC
HCT: 17.4 % — ABNORMAL LOW (ref 40.0–52.0)
HGB: 5.9 g/dL — ABNORMAL LOW (ref 13.0–18.0)
MCH: 29.6 pg (ref 26.0–34.0)
MCHC: 33.9 g/dL (ref 32.0–36.0)
MCV: 87 fL (ref 80–100)
Platelet: 231 10*3/uL (ref 150–440)
RBC: 1.99 10*6/uL — ABNORMAL LOW (ref 4.40–5.90)
RDW: 17.8 % — AB (ref 11.5–14.5)
WBC: 14.3 10*3/uL — ABNORMAL HIGH (ref 3.8–10.6)

## 2013-11-04 LAB — TROPONIN I: Troponin-I: <0.02 ng/mL

## 2013-11-04 LAB — PROTIME-INR
INR: 1.4
Prothrombin Time: 16.8 secs — ABNORMAL HIGH (ref 11.5–14.7)

## 2013-11-04 LAB — PRO B NATRIURETIC PEPTIDE: B-TYPE NATIURETIC PEPTID: 329 pg/mL (ref 0–450)

## 2013-11-04 LAB — HEMOGLOBIN: HGB: 5.1 g/dL — ABNORMAL LOW (ref 13.0–18.0)

## 2013-11-05 LAB — CBC WITH DIFFERENTIAL/PLATELET
Basophil #: 0.1 10*3/uL (ref 0.0–0.1)
Basophil %: 0.5 %
Eosinophil #: 0.2 10*3/uL (ref 0.0–0.7)
Eosinophil %: 1.8 %
HCT: 24.4 % — ABNORMAL LOW (ref 40.0–52.0)
HGB: 8.6 g/dL — ABNORMAL LOW (ref 13.0–18.0)
LYMPHS ABS: 0.7 10*3/uL — AB (ref 1.0–3.6)
Lymphocyte %: 6.1 %
MCH: 31 pg (ref 26.0–34.0)
MCHC: 35.2 g/dL (ref 32.0–36.0)
MCV: 88 fL (ref 80–100)
MONO ABS: 0.8 x10 3/mm (ref 0.2–1.0)
Monocyte %: 6.5 %
Neutrophil #: 10.3 10*3/uL — ABNORMAL HIGH (ref 1.4–6.5)
Neutrophil %: 85.1 %
PLATELETS: 165 10*3/uL (ref 150–440)
RBC: 2.77 10*6/uL — ABNORMAL LOW (ref 4.40–5.90)
RDW: 15.7 % — ABNORMAL HIGH (ref 11.5–14.5)
WBC: 12.1 10*3/uL — ABNORMAL HIGH (ref 3.8–10.6)

## 2013-11-05 LAB — BASIC METABOLIC PANEL
Anion Gap: 7 (ref 7–16)
BUN: 62 mg/dL — AB (ref 7–18)
CO2: 26 mmol/L (ref 21–32)
Calcium, Total: 7.7 mg/dL — ABNORMAL LOW (ref 8.5–10.1)
Chloride: 101 mmol/L (ref 98–107)
Creatinine: 1.6 mg/dL — ABNORMAL HIGH (ref 0.60–1.30)
GFR CALC AF AMER: 42 — AB
GFR CALC NON AF AMER: 36 — AB
GLUCOSE: 101 mg/dL — AB (ref 65–99)
Osmolality: 286 (ref 275–301)
Potassium: 4.2 mmol/L (ref 3.5–5.1)
SODIUM: 134 mmol/L — AB (ref 136–145)

## 2013-11-05 LAB — HEMATOCRIT
HCT: 24.4 % — ABNORMAL LOW (ref 40.0–52.0)
HCT: 23.4 % — ABNORMAL LOW (ref 40.0–52.0)
HCT: 21.5 % — ABNORMAL LOW (ref 40.0–52.0)
HCT: 22.9 % — AB (ref 40.0–52.0)

## 2013-11-05 LAB — URINALYSIS, COMPLETE
BILIRUBIN, UR: NEGATIVE
BLOOD: NEGATIVE
Bacteria: NONE SEEN
Glucose,UR: NEGATIVE mg/dL (ref 0–75)
Ketone: NEGATIVE
LEUKOCYTE ESTERASE: NEGATIVE
NITRITE: NEGATIVE
Ph: 5 (ref 4.5–8.0)
Protein: NEGATIVE
RBC,UR: 4 /HPF (ref 0–5)
Specific Gravity: 1.011 (ref 1.003–1.030)
Squamous Epithelial: NONE SEEN

## 2013-11-05 LAB — PROTIME-INR
INR: 1.2
PROTHROMBIN TIME: 14.7 s (ref 11.5–14.7)

## 2013-11-05 LAB — HEMOGLOBIN
HGB: 8.3 g/dL — ABNORMAL LOW (ref 13.0–18.0)
HGB: 8 g/dL — ABNORMAL LOW (ref 13.0–18.0)
HGB: 7.4 g/dL — ABNORMAL LOW (ref 13.0–18.0)
HGB: 8 g/dL — AB (ref 13.0–18.0)

## 2013-11-05 LAB — MAGNESIUM: Magnesium: 2.3 mg/dL

## 2013-11-06 LAB — HEMOGLOBIN: HGB: 7.8 g/dL — AB (ref 13.0–18.0)

## 2013-11-06 LAB — HEMATOCRIT: HCT: 22.3 % — AB (ref 40.0–52.0)

## 2013-11-07 LAB — BASIC METABOLIC PANEL
Anion Gap: 8 (ref 7–16)
BUN: 44 mg/dL — ABNORMAL HIGH (ref 7–18)
CALCIUM: 8.1 mg/dL — AB (ref 8.5–10.1)
CREATININE: 1.36 mg/dL — AB (ref 0.60–1.30)
Chloride: 105 mmol/L (ref 98–107)
Co2: 26 mmol/L (ref 21–32)
EGFR (African American): 51 — ABNORMAL LOW
EGFR (Non-African Amer.): 44 — ABNORMAL LOW
GLUCOSE: 148 mg/dL — AB (ref 65–99)
Osmolality: 291 (ref 275–301)
Potassium: 4.3 mmol/L (ref 3.5–5.1)
Sodium: 139 mmol/L (ref 136–145)

## 2013-11-08 LAB — BASIC METABOLIC PANEL
ANION GAP: 5 — AB (ref 7–16)
BUN: 40 mg/dL — ABNORMAL HIGH (ref 7–18)
CO2: 26 mmol/L (ref 21–32)
Calcium, Total: 8.3 mg/dL — ABNORMAL LOW (ref 8.5–10.1)
Chloride: 109 mmol/L — ABNORMAL HIGH (ref 98–107)
Creatinine: 1.34 mg/dL — ABNORMAL HIGH (ref 0.60–1.30)
GFR CALC AF AMER: 52 — AB
GFR CALC NON AF AMER: 45 — AB
GLUCOSE: 125 mg/dL — AB (ref 65–99)
Osmolality: 291 (ref 275–301)
Potassium: 3.9 mmol/L (ref 3.5–5.1)
SODIUM: 140 mmol/L (ref 136–145)

## 2013-11-08 LAB — HEMOGLOBIN: HGB: 6.2 g/dL — ABNORMAL LOW (ref 13.0–18.0)

## 2013-11-09 LAB — HEMOGLOBIN: HGB: 7.3 g/dL — AB (ref 13.0–18.0)

## 2013-11-10 ENCOUNTER — Ambulatory Visit: Payer: Self-pay | Admitting: Internal Medicine

## 2013-11-10 LAB — CBC WITH DIFFERENTIAL/PLATELET
BASOS ABS: 0 10*3/uL (ref 0.0–0.1)
Basophil %: 0.3 %
EOS ABS: 0.3 10*3/uL (ref 0.0–0.7)
EOS PCT: 3.9 %
HCT: 22.7 % — ABNORMAL LOW (ref 40.0–52.0)
HGB: 7.4 g/dL — ABNORMAL LOW (ref 13.0–18.0)
Lymphocyte #: 0.6 10*3/uL — ABNORMAL LOW (ref 1.0–3.6)
Lymphocyte %: 8.2 %
MCH: 29.4 pg (ref 26.0–34.0)
MCHC: 32.4 g/dL (ref 32.0–36.0)
MCV: 91 fL (ref 80–100)
MONO ABS: 0.6 x10 3/mm (ref 0.2–1.0)
Monocyte %: 7.2 %
NEUTROS PCT: 80.4 %
Neutrophil #: 6.2 10*3/uL (ref 1.4–6.5)
PLATELETS: 143 10*3/uL — AB (ref 150–440)
RBC: 2.51 10*6/uL — ABNORMAL LOW (ref 4.40–5.90)
RDW: 16.4 % — AB (ref 11.5–14.5)
WBC: 7.5 10*3/uL (ref 3.8–10.6)

## 2013-11-11 LAB — HEMOGLOBIN: HGB: 7.8 g/dL — AB (ref 13.0–18.0)

## 2013-11-12 LAB — HEMOGLOBIN: HGB: 7.6 g/dL — ABNORMAL LOW (ref 13.0–18.0)

## 2013-11-13 LAB — HEMOGLOBIN: HGB: 7.5 g/dL — ABNORMAL LOW (ref 13.0–18.0)

## 2013-12-11 ENCOUNTER — Ambulatory Visit: Payer: Self-pay | Admitting: Internal Medicine

## 2013-12-11 DEATH — deceased

## 2014-01-10 DEATH — deceased

## 2015-01-02 NOTE — H&P (Signed)
PATIENT NAME:  Derek [name], Derek [name] MR#:  [id number] DATE OF BIRTH:  Aug 21, 1917  DATE OF ADMISSION:  03/08/2013  PRIMARY CARE PHYSICIAN: Dr. Belinda Fisher   REFERRING PHYSICIAN: Dr. Owens Shark.   CHIEF COMPLAINT: Feels dizzy and fell down to the floor.   HISTORY OF PRESENT ILLNESS: Derek [name] is a 81 year old white male with history of hypertension, diabetes mellitus, insulin-dependent, obesity, sick sinus syndrome, status post pacemaker placement and osteoarthritis. He woke up in the middle of the night to urinate and started to experience dizziness and fell down to the floor. He was unable to get up by himself. Concerning this, called his wife and is brought to the Emergency Department. Workup in the Emergency Department with EKG and cardiac enzymes were unremarkable. Orthostatic blood pressures laying 756, standing systolic blood pressure of 128. The patient continued to have similar symptoms after coming to the Emergency Department. As mentioned above, workup in the Emergency Department with EKG and cardiac enzymes were completely unremarkable. No obvious lab abnormalities are noted. No signs of any infection are noted. The patient denies having any chest pain, palpitations, nausea, vomiting; however, has been experiencing increased number of stools for the last 2 days.   PAST MEDICAL HISTORY:  1.  Osteoarthritis.  2.  Gout.  3.  Sick sinus syndrome, status post pacemaker placement.  4.  Glaucoma.  5.  Hyperlipidemia.  6.  Hypertension.  7.  Diabetes mellitus, insulin-dependent.   PAST SURGICAL HISTORY:  1.  Cholecystectomy.  2.  Prostatectomy.  3.  Cataract of both eyes.  5.  Back surgery.   ALLERGIES: PENICILLIN.   HOME MEDICATIONS:  1.   5 mg once a day.  2.   1 mg 3 times a day.  3.  Plavix 75 mg once a day.  4.  Lasix 20 mg daily.  5.  Lantus 25 units subcutaneous once daily. 6.  Avapro 150 mg once a day.  7.  Aspirin 81 mg once a day.   SOCIAL HISTORY: No history of smoking,  drinking, alcohol or using illicit drugs. He lives with his 2 daughters.   FAMILY HISTORY: Significant for coronary artery disease, hypertension and cancer.   REVIEW OF SYSTEMS: CONSTITUTIONAL: Denies having any generalized weakness. EYES: No change in vision.  ENT: No change in hearing. No tinnitus. No sore throat. RESPIRATORY: No cough or shortness of breath. CARDIOVASCULAR: No chest pain or palpitations. No pedal edema. GASTROINTESTINAL: No nausea or vomiting. Increased frequency of stools for the last 2 days. GENITOURINARY: No dysuria or hematuria. ENDOCRINE: No polyuria or polydipsia. He carries the diagnosis of diabetes mellitus. HEMATOLOGIC: No easy bruising or bleeding. SKIN: No rash or lesions. MUSCULOSKELETAL: He has chronic back pain. NEUROLOGIC: No numbness or weakness in any part of the body.   PHYSICAL EXAMINATION:  GENERAL: This is a well-built, well-nourished, obese male lying down in the bed, not in distress.  VITAL SIGNS: Temperature 97.9, pulse 61, blood pressure 433/29 systolic laying, standing 128/69, respiratory rate of 16, oxygen saturation is 99% on room air.  HEENT: Head is normocephalic, atraumatic. No scleral icterus. Conjunctivae normal. Pupils equal and reactive to light. Mucous membranes dry. No pharyngeal erythema.  NECK: Supple. No lymphadenopathy. No JVD. No carotid bruit.  CHEST: He has no focal tenderness. LUNGS: Bilaterally clear to auscultation.  HEART: S1, S2 regular. No murmurs. No pedal edema. Pulses 2+.  ABDOMEN: Obese. Bowel sounds present. Soft, nontender, nondistended. I could not appreciate any hepatosplenomegaly.  SKIN: He has a laceration on  the right arm .  MUSCULOSKELETAL: Good range of motion; however, continues to complain of back pain.  NEUROLOGIC: The patient is alert, oriented to place, person and time. Cranial nerves II through XII intact. No motor or sensory deficits.   LABORATORY DATA: Troponin is less than 0.02. Glucose 103. Complete  metabolic panel is completely within normal limits except for BUN 30, creatinine 1.6, which is pretty much at his baseline. CBC: WBC of 13.5, hemoglobin 12.1, platelet count of 172. UA negative for nitrites and leukocyte esterase. CT head without contrast: No acute intracranial abnormality, chronic involutional changes without evidence of acute abnormality.   EKG, 12-lead: Normal sinus rhythm with a paced rhythm.   ASSESSMENT AND PLAN: Mr. Verville is a 81 year old male, who comes to the Emergency Department with feeling dizziness upon standing.  1.  Presyncope: This seems to be more from the orthostatic hypotension from possible dehydration. We will continue to give IV fluids and repeat the orthostatic blood pressures as well as follow up with his symptoms. Continue to rule out cardiac enzymes concerning about the patient's multiple risk factors. We will also check carotid Dopplers. The patient recently had echocardiogram in 01/2013, which was normal. Normal ejection fraction and no obvious abnormalities. It showed mild sclerosis of the aortic valve.  2.  Diabetes mellitus: Continue with Lantus as well as sliding scale insulin.  3.  Hypertension: Currently well controlled. Continue the home medications.  4.  Keep the patient on deep vein thrombosis prophylaxis with Lovenox.  5.  Right forearm laceration: Continue wound care.   TIME SPENT: 45 minutes.   ____________________________ Monica Becton, MD pv:aw D: 03/08/2013 08:17:17 ET T: 03/08/2013 08:44:25 ET JOB#: [id number]  cc: Monica Becton, MD, <Dictator> Lenard Simmer, MD Monica Becton MD ELECTRONICALLY SIGNED 03/10/2013 8:43

## 2015-01-02 NOTE — Discharge Summary (Signed)
PATIENT NAME:  Derek [name], Derek [name] MR#:  [id number] DATE OF BIRTH:  10-22-1916  DATE OF ADMISSION:  03/08/2013 DATE OF DISCHARGE:  06/28/201406/30/2014  PRESENTING COMPLAINT: Weakness and fall.   DISCHARGE DIAGNOSES: 1.  Orthostatic hypotension. 2.  Type 2 diabetes, on insulin.  3.  Hypertension.  4.  Debility. 5.  Dehydration appeared secondary to prerenal azotemia.   CODE STATUS: FULL CODE.   DISPOSITION:  The patient is being discharged to [facility] with home health PT.  DISCHARGE MEDICATIONS: 1.  Plavix 75 mg daily. 2.  Avapro 150 mg p.o. daily.  3.  Aspirin 81 mg daily.  4.  Prandin 1 mg t.i.d.  5.  Lantus 25 units daily.  6.  Tradjenta 5 mg p.o. daily.  7.  Docusate 200 mg at bedtime.  8.  Lovastatin 40 mg at bedtime.  9.  Dorzolamide ophthalmic solution 2% one drop to left eye twice a day.  10.  Vitamin E 100 international units daily.  11.  Alendronate 70 mg weekly on Fridays.  12.  Ketorolac 10 mg p.o. every 6 hours as needed.  13.  Lasix 20 mg 1/2 tablet daily.  14.  Ambien 10 mg at bedtime.  15.  Lorazepam 1 mg b.i.d. p.r.n. for anxiety.   DISCHARGE DIET: Low sodium, carbohydrate-controlled.  DISCHARGE FOLLOWUP: Up with Dr. Francoise Schaumann in 1 to 2 weeks.   LABORATORY AND DIAGNOSTICS: Creatinine at discharge was 1.42, sodium is 139, and potassium is 4.4. UA is negative for UTI. White count is 10.3, H and H are 11.7 and 33.9. Cardiac enzymes x 3 negative. LFTs within normal limits.   Ultrasound carotid Doppler showed there is calcified and soft plaque but no evidence of stenosis greater than 50 to 60%.   BRIEF SUMMARY OF HOSPITAL COURSE:  1.  [name] is a 81 year old Caucasian gentleman who comes in from [facility] after he felt dizzy and fell on the floor. He was admitted with presyncopal episode, possibly due to orthostatic hypotension. The patient received some IV fluids. Blood pressure stabilized.  His p.o. home medications were  resumed. He remained asymptomatic, ambulated well using a walker with physical therapy. Home health PT was recommended, which was set up prior to discharge.  2.  Type 2 diabetes. Insulin Lantus and sliding-scale were continued.  3.  Hypertension.  Home meds were resumed prior to discharge.  4.  Dehydration, appeared prerenal azotemia.  It resolved after IV fluids.   Hospital stay otherwise remained stable. The patient remained a FULL CODE.   TIME SPENT: 40 minutes.  ____________________________ Hart Rochester Posey Pronto, MD sap:sb D: 03/11/2013 07:06:00 ET T: 03/11/2013 07:32:49 ET JOB#: [id number]  cc: Jermel Artley A. Posey Pronto, MD, <Dictator> Lenard Simmer, MD Ilda Basset MD ELECTRONICALLY SIGNED 03/28/2013 7:30

## 2015-01-02 NOTE — Discharge Summary (Signed)
PATIENT NAME:  Derek [name], CARDIN MR#:  [id number] DATE OF BIRTH:  06-24-1917  DATE OF ADMISSION:  03/11/2013 DATE OF DISCHARGE:  03/14/2013  PRESENTING COMPLAINT: Confusion and falls with left arm laceration.   DISCHARGE DIAGNOSES: 1.  Labile hypertension, improved. 2.  Dehydration, resolved.  3.  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  4.  Glaucoma.  5.  Sick sinus syndrome, status post pacemaker.   CONDITION ON DISCHARGE: Fair.   CODE STATUS: FULL CODE.  DISCHARGE INSTRUCTIONS:  Two gram sodium, ADA 1800 calorie diet. Physical therapy. Fall precautions.   DISCHARGE MEDICATIONS: 1.  Tylenol 650 mg q. 4 hours p.r.n.  2.  Plavix 75 mg daily.  3.  Dorzolamide hydrochloride 2% ophthalmic drops to both eye b.i.d. 4.  Insulin sliding scale.  5.  Insulin Lantus 20 units at bedtime.  6.  Prandin 1 mg t.i.d.  7.  Tramadol 50 mg q. 6 hours p.r.n.  8.  Aspirin 81 mg daily.  9.  Seroquel 25 mg at bedtime p.r.n. for agitation.  10.  Dulcolax EC 10 mg at bedtime.  11.  Vitamin E 200 mg daily.  12.  Irbesartan 75 mg daily.  13.  Docusate 200 mg at bedtime.   CONSULTATIONS: Physical therapy.   BRIEF SUMMARY OF HOSPITAL COURSE: Mr. Strohmeier is a 81 year old Caucasian gentleman with history of labile hypertension and type 2 diabetes who came in with recurrent admission secondary to recurrent falls. He was admitted with:  1.  Recurrent falls with laceration and right forehead bruise. It was suspected due to labile hypertension due to medication with Ativan and mild dehydration. Carotid Doppler showed some bilateral calcified plaque, no stenosis. CT head was negative during previous admission. The patient remained asymptomatic. Thereafter, blood pressure was stabilized. Avapro dose was decreased to 75 mg daily.  2.  Type 2 diabetes. Lantus as well as Prandin was continued. Lady Gary was discontinued.  3.  Hypertension. Home meds were adjusted. Avapro was changed to 75 mg daily.  4.  Debility,  generalized weakness and falls. Physical therapy recommended rehab. The patient will be discharged to [facility] today.   Discharge plan was discussed with the patient's wife. The patient remained a FULL CODE.   TIME SPENT: 40 minutes.  ____________________________ Hart Rochester Posey Pronto, MD sap:sb D: 03/14/2013 09:20:46 ET T: 03/14/2013 09:29:10 ET JOB#: [id number]  cc: Zyree Traynham A. Posey Pronto, MD, <Dictator> Lenard Simmer, MD Ilda Basset MD ELECTRONICALLY SIGNED 04/01/2013 18:56

## 2015-01-02 NOTE — H&P (Signed)
PATIENT NAME:  Derek [name], Derek [name] MR#:  [id number] DATE OF BIRTH:  1917/05/26  DATE OF ADMISSION:  03/10/2013  PRIMARY CARE PHYSICIAN:  Dr. Belinda Fisher.   REFERRING PHYSICIAN:  Dr. Conni Slipper.   CHIEF COMPLAINT:  Frequent falls.   HISTORY OF PRESENT ILLNESS:  Derek [name] is a 81 year old pleasant white male with history of hypertension, diabetes mellitus, insulin-dependent, obesity, sick sinus syndrome, status post pacemaker placement. Experienced falls at the assisted living facility. Concerning this, the patient was brought to the Emergency Department. The patient was found to be orthostatic positive at that time. The patient was given IV fluids and carotid Dopplers were done, showed 50 to 60% blockage bilaterally. Evaluated by the physical therapy and was able to walk with the help of a walker. The patient was discharged back to the assisted living facility yesterday on 03/09/2013. After went to the nursing home, the patient was able to walk across the hallway. However, when the nurses woke him up this morning, the patient was unable to stand up, was  falling down, fell down to the left side lacerating the left forearm. Workup in the Emergency Department: The CT head did not show any acute intracranial abnormality. The patient denies having any chest pain, weakness any part of the body. The patient is somnolent; however, easily arousable, answers questions appropriately. Per family, patient was given an anxiety pill for last night. The patient is also experiencing hallucinations. The patient is needing 2-people assistant to stand up and having severe weakness in the lower extremities. Falling down to the ground.  Concerning this, assisted living facility is not able to provide care and wife also is unable to provide any care at this time. The decision is made to admit the patient has inpatient, especially concerning patient's hallucinations, severe generalized weakness. The patient states that he is  "dry as a bone."   PAST MEDICAL HISTORY: 1.  Osteoarthritis.  2.  Gout.  3.  Sick sinus syndrome, status post pacemaker placement.  4.  Glaucoma.  5.  Hyperlipidemia.  6.  Hypertension.  7.  Diabetes mellitus, insulin-dependent.   PAST SURGICAL HISTORY: 1.  Cholecystectomy.  2.  Prostatectomy.  3.  Cataract of both eyes.  4.  Back surgery.   ALLERGIES:  PENICILLIN.   HOME MEDICATIONS: 1.  Ambien 10 mg at bedtime.  2.  Vitamin E 1 capsule daily.  3.  Tradjenta 1 tablet once a day.  4.  Prandin 1 tablet 3 times a day.  5.  Plavix 1 tablet once a day.  6.  Lovastatin 40 mg daily.  7.  Lorazepam 1 mg tablets 2 times a day.  8.  Lasix 10 mg once a day.  9.  Lantus 25 units once a day.  10.  _____ 1 tablet every 6 hours.  11.  Dorzolamide ophthalmic drops.  12.  Docusate sodium once a day.  13.  Avapro 150 mg once a day.  14. Aspirin 81 mg once a day.  16.  Alendronate 70 mg oral once a day.   SOCIAL HISTORY:  No history of smoking, drinking alcohol or using illicit drugs. Has a wife and 2 daughters; however, currently living in assisted living facility.   FAMILY HISTORY:  Significant for coronary artery disease, hypertension and cancer.   REVIEW OF SYSTEMS:  Complaints of generalized weakness.  EYES:  No change in vision.  EARS:  No change in hearing. The patient is hard of hearing. No tinnitus. Has no sore throat.  RESPIRATORY:  No cough, shortness of breath.  CARDIOVASCULAR: No  chest pain or palpitations. No lower extremity swelling.  GASTROINTESTINAL:  No nausea, vomiting or abdominal pain.  GENITOURINARY:  No dysuria or hematuria.   ENDOCRINE:  No polyphagia or polydipsia. The patient has a diagnosis of diabetes mellitus.  HEMATOLOGIC:  No easy bruising or bleeding.  SKIN:  Has lacerations from a previous fall on the right side. Currently has lacerations on the left side.  MUSCULOSKELETAL:  Has chronic back pain.  NEUROLOGIC:  No numbness or weakness in any part of  the body.   PHYSICAL EXAMINATION:  GENERAL:  Reveals a well-built, well-nourished obese male lying down in the bed, not in distress, somnolent.  VITAL SIGNS:  Temperature 97.5, pulse 73, blood pressure 170/69, respiratory rate of 18, oxygen saturations 95% on room air.  HEENT:  Head normocephalic, atraumatic. Eyes:  No sclerae icterus. Conjunctivae normal. Pupils equal and react to light. Extraocular movements are intact. Mucous membranes dry. No pharyngeal erythema.  NECK:  Supple. No lymphadenopathy, no JVD, no carotid bruit.  CHEST:  Has focal tenderness on the right lateral aspect of the chest.  HEART:  S1, S2 regular. No murmurs are heard.  ABDOMEN:  Bowel sounds, plus soft, nontender, nondistended. No hepatosplenomegaly.  MUSCULOSKELETAL:  Has lacerations on both forearms. Good range of motion in all the extremities.  NEUROLOGICAL:  The patient is somewhat confused, having had hallucinations, trying to pick up things in the air, as patient was picking up the pills and putting into his mouth. Is able to answer simple questions. Is able to recognize his family members. Was not very supportive to exam the motor, as patient is falling asleep.   LABORATORIES:  Currently pending.   ASSESSMENT AND PLAN:  1.  Derek [name] is a 81 year old male who comes to the Emergency Department with frequent falls, one fall. The patient seems to be dry. Will obtain CBC, CMP. Will also obtain orthostatic  blood pressures. The patient had already carotid Dopplers done and had recent echocardiogram in May 2014. Continue with intravenous fluids and follow up.  2.  Diabetes mellitus, continue with home dose of Lantus.  3.  Hypertension, poorly controlled. Start back on home medications and follow up. The patient did not tolerate this to control of the blood pressure; however, control the blood pressure over the next 2 to 3 weeks.   4.  Debility. Concerning about patient's frequent falls, the patient will benefit from  going to skilled nursing facility for the rehab.   5.  Dementia with agitation. The patient has been getting Ativan; however, will hold the medications. Add Seroquel 25 mg at bedtime. Discussed with the family regarding the complications of the Seroquel, including arrhythmias and sudden death. The family expressed understanding. Keep the patient on deep vein thrombosis prophylaxis with Lovenox.   TIME SPENT:  Forty-five minutes.    ____________________________ Monica Becton, MD pv:nts D: 03/10/2013 08:11:24 ET T: 03/10/2013 08:50:18 ET JOB#: [id number]  cc: Monica Becton, MD, <Dictator> Lenard Simmer, MD Monica Becton MD ELECTRONICALLY SIGNED 03/26/2013 0:36

## 2015-01-03 NOTE — Discharge Summary (Signed)
PATIENT NAME:  Derek [name], PROKOP MR#:  [id number] DATE OF BIRTH:  May 03, 1917  DATE OF ADMISSION:  11/04/2013 DATE OF DISCHARGE:  11/13/2013  DISCHARGE DIAGNOSES:  1.  Severe acute blood loss anemia due to hematoma requiring fresh plasma and multiple units of packed red blood cells, status post transfusion.  2.  Left leg hematoma and abdominal hematoma.  3.  Hypovolemic shock, resolved.  4.  Hypoglycemia.  5.  Chronic back pain.  6.  History of right upper extremity deep vein thrombosis, which was it recently diagnosed.  7.  Osteoarthritis.  8.  Gout.  9.  History of sick sinus syndrome, status post pacemaker.  10. Glaucoma.  11. Hyperlipidemia.  12. Hypertension. 13. Diabetes.   SIGNIFICANT EVALUATIONS: The patient's blood work on admission, BUN 73, creatinine 1.84, sodium was 134. WBC 14.3, hemoglobin initially was 5.9, later 5.1 on the same day. Troponin was negative. WBC count 14.3. CT scan of the abdomen and pelvis without contrast showing abnormal mass-like appearance in the anterior compartment of the left upper thigh, likely acute hematoma, bilateral airway thickening with plaque-like thickening in the left main stem bronchus.   HISTORY OF PRESENT ILLNESS: The patient is a 81 year old white male, who was brought from a nursing home. He was brought into the ED after a fall. The patient was evaluated in the ED and he had recently been started on anticoagulation with Lovenox and Coumadin for a right upper extremity DVT. The patient was noted to have significant drop in his hemoglobin in the ED. He underwent evaluation for blood loss and a hematoma was revealed on a CT of the abdomen. The patient was given multiple units of packed RBCs and FFPs. He was also seen by surgery, who recommended correcting his coagulopathy and transfusing him as needed. There was no surgical intervention recommended. The patient continued to receive transfusions and his hemoglobin continued to stay labile. He also  showed significant amount of weakness. He is not eating or drinking much. He has shown not much improvement. Palliative care consult was obtained and goals of therapy were discussed with the patient's family and then finally it was decided to make him comfort care and was transferred to hospice home.   MEDICATIONS: At hospice home, valproic acid 125 mg 2 caps b.i.d. with meals, quetiapine 50 mg 1 tab p.o. b.i.d., morphine 20 mg/mL 0.25 to 0.5 every 1 to 2 hours as needed for pain and dyspnea, lorazepam 0.5, 1 to 2 tabs sublingual 2 to 4 hours as needed for agitation and anxiety.  DIEST: As tolerated.   ACTIVITY: As tolerated.   TIME SPENT: 35 minutes spent on this discharge.  ____________________________ Lafonda Mosses. Posey Pronto, MD shp:aw D: 11/14/2013 08:32:26 ET T: 11/14/2013 09:02:03 ET JOB#: [id number]  cc: Graeson Nouri H. Posey Pronto, MD, <Dictator> Alric Seton MD ELECTRONICALLY SIGNED 11/19/2013 15:26

## 2015-01-03 NOTE — H&P (Signed)
PATIENT NAME:  Derek [name], Derek [name] MR#:  [id number] DATE OF BIRTH:  05-27-1917  DATE OF ADMISSION:  11/04/2013  PRIMARY CARE PHYSICIAN: Lenard Simmer, MD  REFERRING PHYSICIAN: Jon Gills. Lord, MD  CHIEF COMPLAINT: Severe acute anemia, fall and leg hematoma.  HISTORY OF PRESENT ILLNESS: The patient is a 81 year old Caucasian male residing in a nursing home. He was brought into the ER after he sustained a fall. After fall, the patient was complaining of left upper extremity pain and edema. Recently, he was diagnosed with right upper extremity DVT, and he is getting therapeutic dose of Lovenox and Coumadin. The patient had a CAT scan of the chest, abdomen and pelvis done in the ER, which has revealed abnormal mass in the left upper thigh which is probably from acute hematoma. The patient's hemoglobin is initially low at 5.9, and subsequently it went down to 5.1. Blood transfusion consent was taken from the patient's wife via phone, and the patient is started on 2 units of blood transfusion. Another unit of blood is crossmatched and kept ready. The patient denies any abdominal pain, but he has huge bruising in the lower abdomen and also in the right flank. Left upper extremity ultrasound was done, and DVT was ruled out. The patient is pleasant but is a poor historian. He does not know why he is sent over to the ER. No family members at bedside. The patient's blood sugar was low at 40 initially, and he was given 1 amp of D50. The patient's Accu-Chek eventually went up to 118. Surgical consult is placed to Dr. Burt Knack. He is aware, and the patient will be seen by him at his earliest possible.   PAST MEDICAL HISTORY:  1. Osteoarthritis.  2. Gout.  3. Sick sinus syndrome, status post pacemaker placement.  4. Glaucoma.  5. Hyperlipidemia.  6. Hypertension.  7. Insulin-dependent diabetes mellitus.   PAST SURGICAL HISTORY:  1. Cholecystectomy.  2. Prostatectomy. 3. Cataracts in both eyes.  4. Back  surgery.  ALLERGIES: THE PATIENT IS ALLERGIC TO PENICILLIN.   PSYCHOSOCIAL HISTORY: Residing in assisted living facility. No history of smoking, alcohol or illicit drug usage, according to the old records.   FAMILY HISTORY: Coronary artery disease, hypertension and cancer run in his family.   HOME MEDICATIONS:  1. Coumadin 5 mg p.o. once a day. 2. Colace 100 mg 2 tablets p.o. once daily. 3. DuoNeb treatments every 4 hours as needed for shortness of breath. 4. Lantus 15 units subcutaneously once daily at 8:00 p.m.  5. Irbesartan 75 mg 1 tablet p.o. once daily. 6. Seroquel 25 mg once a day at bedtime.  7. Seroquel 50 mg 2 times a day.  8. Tylenol 325 mg 2 tablets p.o. q.6 hours as needed. 9. Lovenox 1 mg/kg subcutaneous q.12 hours.  REVIEW OF SYSTEMS: CONSTITUTIONAL: Denies any fever or fatigue. EYES: Denies blurry vision. Has cataracts.  ENT: Denies epistaxis or discharge.  RESPIRATION: Denies cough, COPD.  CARDIOVASCULAR: No chest pain or palpitations.  GASTROINTESTINAL: Denies nausea, vomiting, diarrhea.  GENITOURINARY: No dysuria or hematuria.  ENDOCRINE: Denies polyuria or nocturia. Has diabetes mellitus. HEMATOLOGIC AND LYMPHATIC: Has multiple, multiple bruises in the lower abdominal area and also in the right flank. No active bleeding is noticed. INTEGUMENTARY: No acne, rash, lesions.  MUSCULOSKELETAL: Left upper extremity is swollen with some bruises and excoriations. Right upper extremity nontender, normal-looking, but recently diagnosed with DVT.  NEUROLOGIC: Denies vertigo, ataxia. The patient is a poor historian. PSYCHIATRIC: No ADD or OCD.  PHYSICAL EXAMINATION: VITAL SIGNS: Temperature 97 degrees Fahrenheit, pulse 66, respirations 20, blood pressure is 110/46, pulse oximetry 100%.  GENERAL APPEARANCE: Not under acute distress. Moderately built and nourished.   HEENT: Normocephalic, atraumatic. Pupils are equally reacting to light and accommodation. Positive  cataracts. No scleral icterus. No conjunctival injection. No sinus tenderness. No postnasal drip.  NECK: Supple. No JVD. No thyromegaly. Range of motion is intact.  LUNGS: Clear to auscultation bilaterally. No accessory muscle usage. No anterior chest wall tenderness on palpation.  CARDIAC: S1, S2 normal. Regular rate and rhythm. Positive murmur.  GASTROINTESTINAL: Soft. Bowel sounds are positive in all 4 quadrants. Nontender, nondistended. No hepatosplenomegaly. No masses felt. Huge abdominal bruising is noticed in lower abdominal part, and also humongous bruising was noticed in the right flank area. NEUROLOGIC: Awake, alert and oriented x2.  EXTREMITIES: Left upper extremity is edematous, tender to touch. Multiple excoriations and bruises were noticed. Bilateral lower extremities: No edema, no cyanosis.   LABORATORY AND IMAGING STUDIES: Accu-Chek initially 55; subsequently, it went down to 40, after D50, it went up to 118. Glucose 50. BNP 329,creatinine 1.84, sodium 134, potassium 4.7, chloride 101, CO2 28, anion gap 5, GFR 31, serum osmolality 287, calcium 8.0. Troponin less than 0.02. WBC 14.3, RBC 1.9, hemoglobin 5.9. Repeat one is 5.1, hematocrit 17.4, platelets 231. MCV is 87. PT is 16.8, INR is 1.4. CAT scan of the abdomen and pelvis has revealed abnormal masslike appearance in the anterior compartment of the left upper thigh probably from acute hematoma , subcutaneous edema in the upper thigh. Bilateral airway thickening and plaquelike thickening in the left mainstem bronchus significantly reducing the luminal caliber. This could be from fluid and mucus and inflammation or tumor. New nodules in the right upper lobe and lingula. This may be inflammatory, but warrants followup imaging in 4 to 6 weeks, not able to exclude malignancy. Reduced conspicuity of the scattered regions of the bony sclerosis, probably represents old prostate metastatic lesion. Extensive atherosclerosis. Bilateral exophytic  renal lesions of the varying complexity, not changed. Left upper extremity venous Doppler: Negative DVT. A 12-lead EKG: paced rhythm at 66 beats per minute.   ASSESSMENT AND PLAN: An 81 year old do not resuscitate patient sent over from the assisted living after he sustained a fall. Currently, on therapeutic dose of Lovenox and Coumadin for recent history of right upper extremity deep vein thrombosis. Will be admitted with the following assessment and plan.   1. Acute anemia from therapeutic dose of Lovenox and Coumadin, with right flank and lower abdominal hematoma after a fall. Will be admitted with following assessment and plan. He will be on clear liquid diet. Will provide him 2 units of blood transfusion, and another unit of blood transfusion will be given. The patient will be receiving FFP and will give him vitamin K subcutaneous.  Lovenox and Coumadin are discontinued. Probably the patient is not a good candidate for Lovenox and, Coumadin. Will check hemoglobin and hematocrit q.6 hours. Surgical consult is placed to Dr. Burt Knack.  2. Left upper extremity swelling following fall. Deep vein thrombosis is ruled out.  3. Recent history of deep vein thrombosis in the right upper extremity. As the patient is with elderly age and with a history of frequent falls and a huge abdominal and right flank hematoma,  I would not think he is a good candidate for Coumadin or Lovenox. Will discontinue that.  4. Hypertension and hyperlipidemia. Currently, the patient is on clear liquid diet. Will provide p.o. medications  once the patient is on regular diet.  5. Insulin-dependent diabetes mellitus. The patient is currently hypoglycemic. Will hold off on the Lantus and provide sliding scale insulin. The patient will be on D5 normal saline.   CODE STATUS: He is DNR.  TOTAL CRITICAL CARE TIME SPENT: 45 minutes.   ____________________________ Nicholes Mango, MD ag:lb D: 11/05/2013 01:58:47 ET T: 11/05/2013 05:49:11  ET JOB#: [id number]  cc: Nicholes Mango, MD, <Dictator> Lenard Simmer, MD Nicholes Mango MD ELECTRONICALLY SIGNED 11/20/2013 4:47

## 2015-01-03 NOTE — Consult Note (Signed)
Brief Consult Note: Diagnosis: thigh hematoma.   Patient was seen by consultant.   Comments: pt refused to allow adequate examination in ED. CT rev'd. Will attempt exam later today.  Electronic Signatures: Florene Glen (MD)  (Signed 24-Feb-15 02:47)  Authored: Brief Consult Note   Last Updated: 24-Feb-15 02:47 by Florene Glen (MD)
